# Patient Record
Sex: Female | Born: 1990 | Race: Black or African American | Hispanic: No | Marital: Single | Smoking: Never smoker
Health system: Southern US, Community
[De-identification: ages and names within clinical notes are randomized; demographics above are authoritative.]

## PROBLEM LIST (undated history)

## (undated) ENCOUNTER — Inpatient Hospital Stay (HOSPITAL_COMMUNITY): Payer: Self-pay

## (undated) DIAGNOSIS — N809 Endometriosis, unspecified: Secondary | ICD-10-CM

## (undated) DIAGNOSIS — D573 Sickle-cell trait: Secondary | ICD-10-CM

## (undated) DIAGNOSIS — O9989 Other specified diseases and conditions complicating pregnancy, childbirth and the puerperium: Secondary | ICD-10-CM

## (undated) DIAGNOSIS — O219 Vomiting of pregnancy, unspecified: Secondary | ICD-10-CM

## (undated) DIAGNOSIS — M542 Cervicalgia: Secondary | ICD-10-CM

## (undated) DIAGNOSIS — J45909 Unspecified asthma, uncomplicated: Secondary | ICD-10-CM

## (undated) DIAGNOSIS — M549 Dorsalgia, unspecified: Secondary | ICD-10-CM

## (undated) DIAGNOSIS — F419 Anxiety disorder, unspecified: Secondary | ICD-10-CM

## (undated) DIAGNOSIS — T4145XA Adverse effect of unspecified anesthetic, initial encounter: Secondary | ICD-10-CM

## (undated) DIAGNOSIS — G43019 Migraine without aura, intractable, without status migrainosus: Secondary | ICD-10-CM

## (undated) DIAGNOSIS — R51 Headache: Secondary | ICD-10-CM

## (undated) DIAGNOSIS — T8859XA Other complications of anesthesia, initial encounter: Secondary | ICD-10-CM

## (undated) HISTORY — PX: OOPHORECTOMY: SHX86

## (undated) HISTORY — PX: WISDOM TOOTH EXTRACTION: SHX21

## (undated) HISTORY — DX: Migraine without aura, intractable, without status migrainosus: G43.019

## (undated) HISTORY — PX: OVARIAN CYST REMOVAL: SHX89

---

## 2015-05-04 ENCOUNTER — Inpatient Hospital Stay (HOSPITAL_COMMUNITY)
Admission: AD | Admit: 2015-05-04 | Discharge: 2015-05-04 | Disposition: A | Discharge status: Home / Self Care | Payer: Medicaid Other | Source: Ambulatory Visit | Attending: Obstetrics and Gynecology | Admitting: Obstetrics and Gynecology

## 2015-05-04 ENCOUNTER — Encounter (HOSPITAL_COMMUNITY): Payer: Self-pay

## 2015-05-04 DIAGNOSIS — M545 Low back pain: Secondary | ICD-10-CM | POA: Insufficient documentation

## 2015-05-04 DIAGNOSIS — R51 Headache: Secondary | ICD-10-CM | POA: Insufficient documentation

## 2015-05-04 DIAGNOSIS — O9989 Other specified diseases and conditions complicating pregnancy, childbirth and the puerperium: Secondary | ICD-10-CM | POA: Insufficient documentation

## 2015-05-04 DIAGNOSIS — O210 Mild hyperemesis gravidarum: Secondary | ICD-10-CM | POA: Insufficient documentation

## 2015-05-04 DIAGNOSIS — O219 Vomiting of pregnancy, unspecified: Secondary | ICD-10-CM

## 2015-05-04 DIAGNOSIS — Z3A01 Less than 8 weeks gestation of pregnancy: Secondary | ICD-10-CM | POA: Diagnosis not present

## 2015-05-04 DIAGNOSIS — M549 Dorsalgia, unspecified: Secondary | ICD-10-CM | POA: Diagnosis not present

## 2015-05-04 DIAGNOSIS — J029 Acute pharyngitis, unspecified: Secondary | ICD-10-CM | POA: Insufficient documentation

## 2015-05-04 HISTORY — DX: Other complications of anesthesia, initial encounter: T88.59XA

## 2015-05-04 HISTORY — DX: Endometriosis, unspecified: N80.9

## 2015-05-04 HISTORY — DX: Unspecified asthma, uncomplicated: J45.909

## 2015-05-04 HISTORY — DX: Adverse effect of unspecified anesthetic, initial encounter: T41.45XA

## 2015-05-04 LAB — CBC WITH DIFFERENTIAL/PLATELET
Basophils Absolute: 0 10*3/uL (ref 0.0–0.1)
Basophils Relative: 0 % (ref 0–1)
Eosinophils Absolute: 0 10*3/uL (ref 0.0–0.7)
Eosinophils Relative: 1 % (ref 0–5)
HCT: 33.8 % — ABNORMAL LOW (ref 36.0–46.0)
HEMOGLOBIN: 11.4 g/dL — AB (ref 12.0–15.0)
LYMPHS ABS: 1 10*3/uL (ref 0.7–4.0)
LYMPHS PCT: 15 % (ref 12–46)
MCH: 28.3 pg (ref 26.0–34.0)
MCHC: 33.7 g/dL (ref 30.0–36.0)
MCV: 83.9 fL (ref 78.0–100.0)
MONOS PCT: 7 % (ref 3–12)
Monocytes Absolute: 0.5 10*3/uL (ref 0.1–1.0)
NEUTROS ABS: 5.2 10*3/uL (ref 1.7–7.7)
Neutrophils Relative %: 77 % (ref 43–77)
Platelets: 208 10*3/uL (ref 150–400)
RBC: 4.03 MIL/uL (ref 3.87–5.11)
RDW: 14.3 % (ref 11.5–15.5)
WBC: 6.8 10*3/uL (ref 4.0–10.5)

## 2015-05-04 LAB — COMPREHENSIVE METABOLIC PANEL
ALT: 15 U/L (ref 14–54)
ANION GAP: 6 (ref 5–15)
AST: 20 U/L (ref 15–41)
Albumin: 3.4 g/dL — ABNORMAL LOW (ref 3.5–5.0)
Alkaline Phosphatase: 44 U/L (ref 38–126)
BUN: 8 mg/dL (ref 6–20)
CO2: 25 mmol/L (ref 22–32)
Calcium: 8.9 mg/dL (ref 8.9–10.3)
Chloride: 104 mmol/L (ref 101–111)
Creatinine, Ser: 0.75 mg/dL (ref 0.44–1.00)
GFR calc Af Amer: >60 mL/min (ref >60)
GFR calc non Af Amer: >60 mL/min (ref >60)
Glucose, Bld: 84 mg/dL (ref 65–99)
Potassium: 4.2 mmol/L (ref 3.5–5.1)
Sodium: 135 mmol/L (ref 135–145)
Total Bilirubin: 0.4 mg/dL (ref 0.3–1.2)
Total Protein: 6.8 g/dL (ref 6.5–8.1)

## 2015-05-04 LAB — URINALYSIS, ROUTINE W REFLEX MICROSCOPIC
Bilirubin Urine: NEGATIVE
Glucose, UA: NEGATIVE mg/dL
Ketones, ur: NEGATIVE mg/dL
Leukocytes, UA: NEGATIVE
Nitrite: NEGATIVE
PROTEIN: NEGATIVE mg/dL
SPECIFIC GRAVITY, URINE: 1.02 (ref 1.005–1.030)
Urobilinogen, UA: 0.2 mg/dL (ref 0.0–1.0)
pH: 5.5 (ref 5.0–8.0)

## 2015-05-04 LAB — URINE MICROSCOPIC-ADD ON: WBC, UA: NONE SEEN WBC/hpf (ref <3)

## 2015-05-04 LAB — POCT PREGNANCY, URINE: Preg Test, Ur: POSITIVE — AB

## 2015-05-04 LAB — RAPID STREP SCREEN (MED CTR MEBANE ONLY): Streptococcus, Group A Screen (Direct): NEGATIVE

## 2015-05-04 MED ORDER — LIDOCAINE VISCOUS 2 % MT SOLN
15.0000 mL | Freq: Once | OROMUCOSAL | Status: AC
  Administered 2015-05-04: 15 mL via OROMUCOSAL
  Filled 2015-05-04: qty 15

## 2015-05-04 MED ORDER — MENTHOL 3 MG MT LOZG
1.0000 | LOZENGE | Freq: Once | OROMUCOSAL | Status: AC
  Administered 2015-05-04: 3 mg via ORAL
  Filled 2015-05-04: qty 9

## 2015-05-04 MED ORDER — PHENOL 1.4 % MT LIQD
1.0000 | OROMUCOSAL | Status: DC | PRN
  Administered 2015-05-04: 1 via OROMUCOSAL
  Filled 2015-05-04: qty 177

## 2015-05-04 MED ORDER — CYCLOBENZAPRINE HCL 10 MG PO TABS
10.0000 mg | ORAL_TABLET | Freq: Once | ORAL | Status: AC
  Administered 2015-05-04: 10 mg via ORAL
  Filled 2015-05-04: qty 1

## 2015-05-04 MED ORDER — PROMETHAZINE HCL 25 MG PO TABS: 25.0000 mg | ORAL_TABLET | Freq: Four times a day (QID) | ORAL | Status: DC | PRN

## 2015-05-04 MED ORDER — LACTATED RINGERS IV BOLUS (SEPSIS)
1000.0000 mL | Freq: Once | INTRAVENOUS | Status: AC
  Administered 2015-05-04: 1000 mL via INTRAVENOUS

## 2015-05-04 MED ORDER — CYCLOBENZAPRINE HCL 10 MG PO TABS: 10.0000 mg | ORAL_TABLET | Freq: Two times a day (BID) | ORAL | Status: DC | PRN

## 2015-05-04 MED ORDER — PROMETHAZINE HCL 25 MG/ML IJ SOLN
Freq: Once | INTRAMUSCULAR | Status: AC
  Administered 2015-05-04: 18:00:00 via INTRAVENOUS
  Filled 2015-05-04: qty 1000

## 2015-05-04 MED ORDER — PROMETHAZINE HCL 25 MG/ML IJ SOLN: 12.5000 mg | Freq: Once | INTRAMUSCULAR | Status: DC

## 2015-05-04 MED ORDER — ACETAMINOPHEN 500 MG PO TABS
1000.0000 mg | ORAL_TABLET | Freq: Once | ORAL | Status: AC
  Administered 2015-05-04: 1000 mg via ORAL
  Filled 2015-05-04: qty 2

## 2015-05-04 NOTE — Discharge Instructions (Signed)

## 2015-05-04 NOTE — MAU Provider Note (Signed)
  History     CSN: [id number]  Arrival date and time: 05/04/15 1346   None     Chief Complaint  Patient presents with  . Generalized Body Aches  . Fever  . Headache  . Sore Throat  . Morning Sickness   HPI Pt is 7 weeks0d pregnant that presents with sore throat, achy neck, headache,low back pain, vomiting, Feeling hot and having chills.  Pt has had decreased appetite and vomiting- able to keep gingerale and gatorade  RN note: Onset of body aches, fever, sore throat, headache x 2 days, morning sickness continues  No past medical history on file.  No past surgical history on file.  No family history on file.  History  Substance Use Topics  . Smoking status: Not on file  . Smokeless tobacco: Not on file  . Alcohol Use: Not on file    Allergies: Allergies not on file  No prescriptions prior to admission    Review of Systems  Constitutional: Positive for fever, chills and malaise/fatigue.  HENT: Positive for sore throat.   Respiratory: Positive for cough. Negative for hemoptysis and wheezing.        Clear sputum  Gastrointestinal: Positive for nausea and vomiting. Negative for abdominal pain.  Genitourinary: Negative for dysuria, urgency and frequency.  Musculoskeletal: Positive for back pain.  Neurological: Positive for headaches.   Physical Exam   Height 5' 9.5" (1.765 m), weight 244 lb 6.4 oz (110.859 kg).  Physical Exam  Nursing note and vitals reviewed. Constitutional: She is oriented to person, place, and time. She appears well-developed and well-nourished.  HENT:  Head: Normocephalic.  Bilateral edematous pharynx /tonsils Left submandibular node enlarged and tender  Eyes: Pupils are equal, round, and reactive to light.  Neck: Normal range of motion. Neck supple.  Cardiovascular: Normal rate.   Respiratory: Effort normal.  GI: Soft. She exhibits no distension. There is no tenderness. There is no rebound and no guarding.  Musculoskeletal: Normal range  of motion.  Neurological: She is alert and oriented to person, place, and time.  Skin: Skin is warm and dry.  Psychiatric: She has a normal mood and affect.    MAU Course  Procedures IVF Tylenol for headache, sore throat and back ache Pt did not get relief of backache with tylenol and now nauseated Will give flexeril and phenergan Sore throat persists - will given cepracol, chloraseptic spray and vicous lidocaine which helped 2nd IVF with phenergan 25 mg- nausea improved Back pain improved with Flexeril Dr. Willis Modena consulted Assessment and Plan  Pharyngitis- Cepacol, Chloraseptic spray; gargle with equal parts of liquid benadryl and cherry maalox and/or salt water Back pain- Rx Flexeril Nausea and vomiting - Rx phenergan-  Tylenol  Rest and fluids- work note until Monday July 25 [redacted]w[redacted]d 1st trimester F/u with Dr. Terri Piedra for [facility] care  Amyra Vantuyl 05/04/2015, 2:07 PM

## 2015-05-04 NOTE — MAU Note (Signed)
Onset of body aches, fever, sore throat, headache x 2 days, morning sickness continues.

## 2015-05-06 LAB — CULTURE, GROUP A STREP: STREP A CULTURE: NEGATIVE

## 2015-05-23 LAB — OB RESULTS CONSOLE HIV ANTIBODY (ROUTINE TESTING): HIV: NONREACTIVE

## 2015-05-23 LAB — OB RESULTS CONSOLE HEPATITIS B SURFACE ANTIGEN: HEP B S AG: NEGATIVE

## 2015-05-23 LAB — OB RESULTS CONSOLE RPR: RPR: NONREACTIVE

## 2015-06-05 ENCOUNTER — Inpatient Hospital Stay (HOSPITAL_COMMUNITY)
Admission: AD | Admit: 2015-06-05 | Discharge: 2015-06-06 | Disposition: A | Discharge status: Home / Self Care | Payer: Medicaid Other | Source: Ambulatory Visit | Attending: Obstetrics and Gynecology | Admitting: Obstetrics and Gynecology

## 2015-06-05 ENCOUNTER — Encounter (HOSPITAL_COMMUNITY): Payer: Self-pay | Admitting: *Deleted

## 2015-06-05 DIAGNOSIS — R112 Nausea with vomiting, unspecified: Secondary | ICD-10-CM | POA: Diagnosis not present

## 2015-06-05 DIAGNOSIS — O2691 Pregnancy related conditions, unspecified, first trimester: Secondary | ICD-10-CM | POA: Diagnosis not present

## 2015-06-05 DIAGNOSIS — R519 Headache, unspecified: Secondary | ICD-10-CM

## 2015-06-05 DIAGNOSIS — J45909 Unspecified asthma, uncomplicated: Secondary | ICD-10-CM | POA: Diagnosis not present

## 2015-06-05 DIAGNOSIS — O26891 Other specified pregnancy related conditions, first trimester: Secondary | ICD-10-CM

## 2015-06-05 DIAGNOSIS — R51 Headache: Secondary | ICD-10-CM | POA: Insufficient documentation

## 2015-06-05 DIAGNOSIS — O219 Vomiting of pregnancy, unspecified: Secondary | ICD-10-CM | POA: Diagnosis not present

## 2015-06-05 DIAGNOSIS — D573 Sickle-cell trait: Secondary | ICD-10-CM | POA: Insufficient documentation

## 2015-06-05 HISTORY — DX: Sickle-cell trait: D57.3

## 2015-06-05 LAB — URINALYSIS, ROUTINE W REFLEX MICROSCOPIC
BILIRUBIN URINE: NEGATIVE
GLUCOSE, UA: NEGATIVE mg/dL
HGB URINE DIPSTICK: NEGATIVE
Ketones, ur: NEGATIVE mg/dL
Leukocytes, UA: NEGATIVE
Nitrite: NEGATIVE
Protein, ur: NEGATIVE mg/dL
Specific Gravity, Urine: 1.015 (ref 1.005–1.030)
Urobilinogen, UA: 0.2 mg/dL (ref 0.0–1.0)
pH: 6 (ref 5.0–8.0)

## 2015-06-05 MED ORDER — PROMETHAZINE HCL 25 MG PO TABS
25.0000 mg | ORAL_TABLET | Freq: Once | ORAL | Status: AC
  Administered 2015-06-05: 25 mg via ORAL
  Filled 2015-06-05: qty 1

## 2015-06-05 MED ORDER — ONDANSETRON 8 MG PO TBDP: 8.0000 mg | ORAL_TABLET | Freq: Once | ORAL | Status: DC

## 2015-06-05 MED ORDER — IBUPROFEN 800 MG PO TABS
800.0000 mg | ORAL_TABLET | Freq: Once | ORAL | Status: AC
  Administered 2015-06-05: 800 mg via ORAL
  Filled 2015-06-05: qty 1

## 2015-06-05 MED ORDER — BUTALBITAL-APAP-CAFFEINE 50-325-40 MG PO TABS: 2.0000 | ORAL_TABLET | Freq: Once | ORAL | Status: DC

## 2015-06-05 NOTE — MAU Provider Note (Signed)
History     CSN: [id number]  Arrival date and time: 06/05/15 2113   First Provider Initiated Contact with Patient 06/05/15 2256      Chief Complaint  Patient presents with  . Headache   Headache  This is a new problem. The current episode started today. The problem occurs constantly. The problem has been unchanged. The pain is located in the left unilateral region. The pain does not radiate. The pain quality is similar to prior headaches. The quality of the pain is described as throbbing. The pain is at a severity of 7/10. Associated symptoms include a fever, nausea and vomiting. Pertinent negatives include no abdominal pain. Nothing aggravates the symptoms. She has tried acetaminophen (last took at 1600 ) for the symptoms. The treatment provided mild relief.  Emesis  This is a new problem. The current episode started in the past 7 days. The problem occurs 5 to 10 times per day. The problem has been unchanged. The emesis has an appearance of stomach contents. There has been no fever. Associated symptoms include a fever and headaches. Pertinent negatives include no abdominal pain or diarrhea. Risk factors: pregnancy  She has tried diet change, increased fluids and sleep for the symptoms. The treatment provided no relief.    Past Medical History  Diagnosis Date  . Endometriosis   . Asthma   . Complication of anesthesia     bp drops  . Sickle cell trait     Past Surgical History  Procedure Laterality Date  . Ovarian cyst removal    . Wisdom tooth extraction    . Oophorectomy      left    History reviewed. No pertinent family history.  Social History  Substance Use Topics  . Smoking status: Never Smoker   . Smokeless tobacco: None  . Alcohol Use: Yes     Comment: not whiile preg    Allergies:  Allergies  Allergen Reactions  . Codeine Other (See Comments)    Reaction: causes sore throat  . Contrast Media [Iodinated Diagnostic Agents] Nausea And Vomiting  . Shellfish  Allergy Swelling    Prescriptions prior to admission  Medication Sig Dispense Refill Last Dose  . acetaminophen (TYLENOL) 500 MG tablet Take 500 mg by mouth every 6 (six) hours as needed for moderate pain.   Past Month at Unknown time  . albuterol (PROVENTIL HFA;VENTOLIN HFA) 108 (90 BASE) MCG/ACT inhaler Inhale 2 puffs into the lungs every 6 (six) hours as needed for wheezing or shortness of breath.   Past Month at Unknown time  . Doxylamine-Pyridoxine (DICLEGIS) 10-10 MG TBEC Take 1 tablet by mouth 4 (four) times daily.   Past Month at Unknown time  . Prenatal Vit-Fe Fumarate-FA (PRENATAL MULTIVITAMIN) TABS tablet Take 1 tablet by mouth daily at 12 noon.   Past Week at Unknown time  . cyclobenzaprine (FLEXERIL) 10 MG tablet Take 1 tablet (10 mg total) by mouth 2 (two) times daily as needed for muscle spasms. 20 tablet 0 More than a month at Unknown time  . promethazine (PHENERGAN) 25 MG tablet Take 1 tablet (25 mg total) by mouth every 6 (six) hours as needed for nausea or vomiting. 30 tablet 0 More than a month at Unknown time    Review of Systems  Constitutional: Positive for fever.  Gastrointestinal: Positive for nausea and vomiting. Negative for abdominal pain, diarrhea and constipation.  Genitourinary: Negative for dysuria, urgency and frequency.  Neurological: Positive for headaches.   Physical Exam  Blood pressure 103/68, pulse 86, temperature 98.9 F (37.2 C), temperature source Oral, resp. rate 16, height 5\' 9"  (1.753 m), weight 112.492 kg (248 lb), last menstrual period 03/16/2015.  Physical Exam  Nursing note and vitals reviewed. Constitutional: She is oriented to person, place, and time. She appears well-developed and well-nourished. No distress.  HENT:  Head: Normocephalic.  Cardiovascular: Normal rate.   Respiratory: Effort normal.  GI: Soft.  Neurological: She is alert and oriented to person, place, and time.  Skin: Skin is warm and dry.  Psychiatric: She has a  normal mood and affect.   Results for orders placed or performed during the hospital encounter of 06/05/15 (from the past 24 hour(s))  Urinalysis, Routine w reflex microscopic (not at [facility])     Status: None   Collection Time: 06/05/15 10:00 PM  Result Value Ref Range   Color, Urine YELLOW YELLOW   APPearance CLEAR CLEAR   Specific Gravity, Urine 1.015 1.005 - 1.030   pH 6.0 5.0 - 8.0   Glucose, UA NEGATIVE NEGATIVE mg/dL   Hgb urine dipstick NEGATIVE NEGATIVE   Bilirubin Urine NEGATIVE NEGATIVE   Ketones, ur NEGATIVE NEGATIVE mg/dL   Protein, ur NEGATIVE NEGATIVE mg/dL   Urobilinogen, UA 0.2 0.0 - 1.0 mg/dL   Nitrite NEGATIVE NEGATIVE   Leukocytes, UA NEGATIVE NEGATIVE     MAU Course  Procedures  MDM 0017: Patient reports that her nausea is gone with phenergan, and her headache is 2/10 with ibuprofen. No vomiting while here. Tolerating PO  0024: D/W Dr. Terri Piedra, ok for dc home. May have phenergan rx for dc.   Assessment and Plan   1. Nausea/vomiting in pregnancy   2. Headache in pregnancy, first trimester    DC home Comfort measures reviewed  1st Trimester precautions  RX: phenergan PRN #[id number]  Return to MAU as needed FU with OB as planned  Follow-up Information    Follow up with [facility], DO.   Specialty:  Obstetrics and Gynecology   Why:  As scheduled   Contact information:   [place] [address] [phone]         Mathis Bud 06/05/2015, 10:58 PM

## 2015-06-05 NOTE — MAU Note (Signed)
Pt 11 wks and was taking diclegis for N&V but it gave her H/A and night sweats and Dr. Terri Piedra took her off the medication.  Keeping liquids down but not solids today. Vomited 8X today.  Pt has a severe headache and took 2 ES tylenol at 1645 without relief.

## 2015-06-06 DIAGNOSIS — O219 Vomiting of pregnancy, unspecified: Secondary | ICD-10-CM | POA: Diagnosis not present

## 2015-06-06 MED ORDER — PROMETHAZINE HCL 25 MG PO TABS: 12.5000 mg | ORAL_TABLET | Freq: Four times a day (QID) | ORAL | Status: DC | PRN

## 2015-06-06 NOTE — Discharge Instructions (Signed)

## 2015-07-30 ENCOUNTER — Inpatient Hospital Stay (HOSPITAL_COMMUNITY)
Admission: AD | Admit: 2015-07-30 | Discharge: 2015-07-31 | Disposition: A | Discharge status: Home / Self Care | Payer: Medicaid Other | Source: Ambulatory Visit | Attending: Obstetrics and Gynecology | Admitting: Obstetrics and Gynecology

## 2015-07-30 DIAGNOSIS — R51 Headache: Secondary | ICD-10-CM | POA: Diagnosis not present

## 2015-07-30 DIAGNOSIS — O26893 Other specified pregnancy related conditions, third trimester: Secondary | ICD-10-CM | POA: Diagnosis not present

## 2015-07-30 DIAGNOSIS — Z3A19 19 weeks gestation of pregnancy: Secondary | ICD-10-CM | POA: Diagnosis not present

## 2015-07-30 DIAGNOSIS — O26892 Other specified pregnancy related conditions, second trimester: Secondary | ICD-10-CM

## 2015-07-30 LAB — URINALYSIS, ROUTINE W REFLEX MICROSCOPIC
Bilirubin Urine: NEGATIVE
GLUCOSE, UA: NEGATIVE mg/dL
HGB URINE DIPSTICK: NEGATIVE
Ketones, ur: 15 mg/dL — AB
Nitrite: NEGATIVE
PH: 6.5 (ref 5.0–8.0)
PROTEIN: NEGATIVE mg/dL
Specific Gravity, Urine: 1.02 (ref 1.005–1.030)
Urobilinogen, UA: 0.2 mg/dL (ref 0.0–1.0)

## 2015-07-30 LAB — URINE MICROSCOPIC-ADD ON

## 2015-07-30 MED ORDER — DEXAMETHASONE SODIUM PHOSPHATE 10 MG/ML IJ SOLN
10.0000 mg | Freq: Once | INTRAMUSCULAR | Status: AC
  Administered 2015-07-30: 10 mg via INTRAVENOUS
  Filled 2015-07-30: qty 1

## 2015-07-30 MED ORDER — LACTATED RINGERS IV BOLUS (SEPSIS)
1000.0000 mL | Freq: Once | INTRAVENOUS | Status: AC
  Administered 2015-07-30: 1000 mL via INTRAVENOUS

## 2015-07-30 MED ORDER — METOCLOPRAMIDE HCL 5 MG/ML IJ SOLN
10.0000 mg | Freq: Once | INTRAMUSCULAR | Status: AC
  Administered 2015-07-30: 10 mg via INTRAVENOUS
  Filled 2015-07-30: qty 2

## 2015-07-30 NOTE — MAU Note (Signed)
Pt reports she has had headaches throughout her pregnancy and has now had a headache since yesterday. Also reports lower abd pressure and tightness since last pm. Also reports she is having trouble breathing today.

## 2015-07-30 NOTE — Discharge Instructions (Signed)

## 2015-07-30 NOTE — MAU Provider Note (Signed)
Chief Complaint: No chief complaint on file.   First Provider Initiated Contact with Patient 07/30/15 2239      SUBJECTIVE HPI: Barbara Haas is a 24 y.o. G1P0 at [redacted]w[redacted]d by LMP who presents to maternity admissions reporting onset of headache yesterday that has gradually worsened today and abdominal cramping/pelvic pressure with onset today. She reports some headaches prior to pregnancy but worsening since pregnancy.  This h/a is associated with light sensitivity but she denies nausea or vomiting.  She reports shortness of breath when she is active or walking, but resolves easily with rest. She denies vaginal bleeding, vaginal itching/burning, urinary symptoms, dizziness, n/v, or fever/chills.     Headache  This is a new problem. The current episode started yesterday. The problem occurs constantly. The problem has been gradually worsening. The pain is located in the bilateral and frontal region. The pain does not radiate. The pain quality is similar to prior headaches. The quality of the pain is described as dull and squeezing. The pain is moderate. Associated symptoms include abdominal pain and photophobia. Pertinent negatives include no blurred vision, dizziness, eye pain, eye watering, fever, hearing loss, loss of balance, nausea, neck pain, rhinorrhea, sinus pressure, tinnitus, visual change, vomiting or weakness. The symptoms are aggravated by bright light. She has tried NSAIDs and acetaminophen for the symptoms. The treatment provided mild relief. Her past medical history is significant for migraine headaches.    Past Medical History  Diagnosis Date  . Endometriosis   . Asthma   . Complication of anesthesia     bp drops  . Sickle cell trait    Past Surgical History  Procedure Laterality Date  . Ovarian cyst removal    . Wisdom tooth extraction    . Oophorectomy      left   Social History   Social History  . Marital Status: Single    Spouse Name: N/A  . Number of Children:  N/A  . Years of Education: N/A   Occupational History  . Not on file.   Social History Main Topics  . Smoking status: Never Smoker   . Smokeless tobacco: Not on file  . Alcohol Use: Yes     Comment: not whiile preg  . Drug Use: No  . Sexual Activity: Yes   Other Topics Concern  . Not on file   Social History Narrative   No current facility-administered medications on file prior to encounter.   Current Outpatient Prescriptions on File Prior to Encounter  Medication Sig Dispense Refill  . acetaminophen (TYLENOL) 500 MG tablet Take 500 mg by mouth every 6 (six) hours as needed for moderate pain.    Marland Kitchen albuterol (PROVENTIL HFA;VENTOLIN HFA) 108 (90 BASE) MCG/ACT inhaler Inhale 2 puffs into the lungs every 6 (six) hours as needed for wheezing or shortness of breath.    . Prenatal Vit-Fe Fumarate-FA (PRENATAL MULTIVITAMIN) TABS tablet Take 1 tablet by mouth daily at 12 noon.    . cyclobenzaprine (FLEXERIL) 10 MG tablet Take 1 tablet (10 mg total) by mouth 2 (two) times daily as needed for muscle spasms. 20 tablet 0  . Doxylamine-Pyridoxine (DICLEGIS) 10-10 MG TBEC Take 1 tablet by mouth 4 (four) times daily.    . promethazine (PHENERGAN) 25 MG tablet Take 1 tablet (25 mg total) by mouth every 6 (six) hours as needed for nausea or vomiting. 30 tablet 0  . promethazine (PHENERGAN) 25 MG tablet Take 0.5-1 tablets (12.5-25 mg total) by mouth every 6 (six) hours as  needed. 30 tablet 0   Allergies  Allergen Reactions  . Codeine Other (See Comments)    Reaction: causes sore throat  . Contrast Media [Iodinated Diagnostic Agents] Nausea And Vomiting  . Shellfish Allergy Swelling    ROS:  Review of Systems  Constitutional: Negative for fever, chills and fatigue.  HENT: Negative for hearing loss, rhinorrhea, sinus pressure and tinnitus.   Eyes: Positive for photophobia. Negative for blurred vision and pain.  Respiratory: Negative for shortness of breath.   Cardiovascular: Negative for  chest pain.  Gastrointestinal: Positive for abdominal pain. Negative for nausea, vomiting, diarrhea and constipation.  Genitourinary: Negative for dysuria, frequency, flank pain, vaginal bleeding, vaginal discharge, difficulty urinating, vaginal pain and pelvic pain.  Musculoskeletal: Negative for neck pain.  Neurological: Positive for headaches. Negative for dizziness, weakness and loss of balance.  Psychiatric/Behavioral: Negative.      I have reviewed patient's Past Medical Hx, Surgical Hx, Family Hx, Social Hx, medications and allergies.   Physical Exam   Patient Vitals for the past 24 hrs:  BP Temp Temp src Pulse Resp SpO2  07/30/15 2146 129/78 mmHg 98.4 F (36.9 C) Oral 75 16 99 %   Constitutional: Well-developed, well-nourished female in no acute distress.  Cardiovascular: normal rate Respiratory: normal effort GI: Abd soft, non-tender. Pos BS x 4 MS: Extremities nontender, no edema, normal ROM Neurologic: Alert and oriented x 4.  GU: Neg CVAT.  FHT 152 by doppler  LAB RESULTS Results for orders placed or performed during the hospital encounter of 07/30/15 (from the past 24 hour(s))  Urinalysis, Routine w reflex microscopic (not at [facility])     Status: Abnormal   Collection Time: 07/30/15 10:07 PM  Result Value Ref Range   Color, Urine YELLOW YELLOW   APPearance CLEAR CLEAR   Specific Gravity, Urine 1.020 1.005 - 1.030   pH 6.5 5.0 - 8.0   Glucose, UA NEGATIVE NEGATIVE mg/dL   Hgb urine dipstick NEGATIVE NEGATIVE   Bilirubin Urine NEGATIVE NEGATIVE   Ketones, ur 15 (A) NEGATIVE mg/dL   Protein, ur NEGATIVE NEGATIVE mg/dL   Urobilinogen, UA 0.2 0.0 - 1.0 mg/dL   Nitrite NEGATIVE NEGATIVE   Leukocytes, UA TRACE (A) NEGATIVE  Urine microscopic-add on     Status: Abnormal   Collection Time: 07/30/15 10:07 PM  Result Value Ref Range   Squamous Epithelial / LPF RARE RARE   WBC, UA 3-6 <3 WBC/hpf   RBC / HPF 0-2 <3 RBC/hpf   Bacteria, UA FEW (A) RARE        IMAGING No results found.  MAU Management/MDM: Ordered labs and reviewed results.  Consult Dr Marvel Plan to review assessment and labs.  Treatments in MAU included LR x 1000 ml, Decadron 10 mg IV, and Reglan 10 mg IV.  No Benadryl given to complete headache cocktail because pt driving.  Pt to go home and take PO Benadryl if h/a persists.  After IV medication/fluids, pt reports abdominal cramping resolved completely and h/a is now very mild. Pt stable at time of discharge.  ASSESSMENT 1. Headache in pregnancy, antepartum, second trimester     PLAN Discharge home   Follow-up Information    Follow up with Logan Bores, MD.   Specialty:  Obstetrics and Gynecology   Why:  As scheduled   Contact information:   [address] [address] [phone]       Follow up with [facility].   Why:  As needed for emergencies   Contact information:   [address] [id number] [place] Certified Nurse-Midwife 07/30/2015  11:57 PM

## 2015-07-30 NOTE — MAU Note (Signed)
Nasal congestion today with a pounding headache.   Took 800mg  ibuprofen at 1500 and tylenol 500mg  @ 1900.  Pt fell asleep and woke up with same pain.

## 2015-09-26 ENCOUNTER — Inpatient Hospital Stay (HOSPITAL_COMMUNITY)
Admission: AD | Admit: 2015-09-26 | Payer: Medicaid Other | Source: Ambulatory Visit | Admitting: Obstetrics and Gynecology

## 2015-10-15 NOTE — L&D Delivery Note (Signed)
Pt complete and at +3 station with urge to push.  Pt pushed for about 36minsL to deliver a viable female infant in LOA position over intact perineum. Anterior and posterior shoulders spontaneously delivered with next two pushes; body easily followed next. Infant placed on mothers abdomen and bulb suction of mouth and nose performed. Cord was then clamped and cut by FOB after >27min. Cord blood obtained. Baby had a vigorous spontaneous cry noted. Placenta then delivered about 5 mins later intact, 3VC shultz. Fundal massage performed and pitocin per protocol. Fundus firm. Periurethral abrasion did not require stitches. Mother and baby stable. Counts correct. Apgars of 9 and 9

## 2015-11-17 LAB — OB RESULTS CONSOLE GBS: STREP GROUP B AG: POSITIVE

## 2015-11-17 LAB — OB RESULTS CONSOLE RUBELLA ANTIBODY, IGM: RUBELLA: IMMUNE

## 2015-11-23 LAB — OB RESULTS CONSOLE GC/CHLAMYDIA
CHLAMYDIA, DNA PROBE: NEGATIVE
GC PROBE AMP, GENITAL: NEGATIVE

## 2015-12-21 ENCOUNTER — Inpatient Hospital Stay (HOSPITAL_COMMUNITY): Payer: Medicaid Other | Admitting: Anesthesiology

## 2015-12-21 ENCOUNTER — Encounter (HOSPITAL_COMMUNITY): Payer: Self-pay

## 2015-12-21 ENCOUNTER — Inpatient Hospital Stay (HOSPITAL_COMMUNITY)
Admission: RE | Admit: 2015-12-21 | Discharge: 2015-12-24 | DRG: 775 | Disposition: A | Discharge status: Home / Self Care | Payer: Medicaid Other | Source: Ambulatory Visit | Attending: Obstetrics and Gynecology | Admitting: Obstetrics and Gynecology

## 2015-12-21 DIAGNOSIS — O480 Post-term pregnancy: Principal | ICD-10-CM | POA: Diagnosis present

## 2015-12-21 DIAGNOSIS — O99824 Streptococcus B carrier state complicating childbirth: Secondary | ICD-10-CM | POA: Diagnosis present

## 2015-12-21 DIAGNOSIS — N809 Endometriosis, unspecified: Secondary | ICD-10-CM | POA: Diagnosis present

## 2015-12-21 DIAGNOSIS — Z3A40 40 weeks gestation of pregnancy: Secondary | ICD-10-CM | POA: Diagnosis not present

## 2015-12-21 DIAGNOSIS — O9952 Diseases of the respiratory system complicating childbirth: Secondary | ICD-10-CM | POA: Diagnosis present

## 2015-12-21 DIAGNOSIS — J45909 Unspecified asthma, uncomplicated: Secondary | ICD-10-CM | POA: Diagnosis present

## 2015-12-21 DIAGNOSIS — D573 Sickle-cell trait: Secondary | ICD-10-CM | POA: Diagnosis present

## 2015-12-21 LAB — CBC
HEMATOCRIT: 31.3 % — AB (ref 36.0–46.0)
Hemoglobin: 10.5 g/dL — ABNORMAL LOW (ref 12.0–15.0)
MCH: 28.1 pg (ref 26.0–34.0)
MCHC: 33.5 g/dL (ref 30.0–36.0)
MCV: 83.7 fL (ref 78.0–100.0)
Platelets: 173 10*3/uL (ref 150–400)
RBC: 3.74 MIL/uL — ABNORMAL LOW (ref 3.87–5.11)
RDW: 14.8 % (ref 11.5–15.5)
WBC: 8.7 10*3/uL (ref 4.0–10.5)

## 2015-12-21 LAB — TYPE AND SCREEN
ABO/RH(D): O POS
Antibody Screen: NEGATIVE

## 2015-12-21 LAB — ABO/RH: ABO/RH(D): O POS

## 2015-12-21 LAB — RPR: RPR Ser Ql: NONREACTIVE

## 2015-12-21 MED ORDER — FENTANYL 2.5 MCG/ML BUPIVACAINE 1/10 % EPIDURAL INFUSION (WH - ANES)
14.0000 mL/h | INTRAMUSCULAR | Status: DC | PRN
  Administered 2015-12-21: 14 mL/h via EPIDURAL
  Filled 2015-12-21: qty 125

## 2015-12-21 MED ORDER — LACTATED RINGERS IV SOLN: 500.0000 mL | Freq: Once | INTRAVENOUS | Status: DC

## 2015-12-21 MED ORDER — PHENYLEPHRINE 40 MCG/ML (10ML) SYRINGE FOR IV PUSH (FOR BLOOD PRESSURE SUPPORT)
80.0000 ug | PREFILLED_SYRINGE | INTRAVENOUS | Status: DC | PRN
  Filled 2015-12-21: qty 2

## 2015-12-21 MED ORDER — ACETAMINOPHEN 325 MG PO TABS: 650.0000 mg | ORAL_TABLET | ORAL | Status: DC | PRN

## 2015-12-21 MED ORDER — PHENYLEPHRINE 40 MCG/ML (10ML) SYRINGE FOR IV PUSH (FOR BLOOD PRESSURE SUPPORT)
80.0000 ug | PREFILLED_SYRINGE | INTRAVENOUS | Status: DC | PRN
  Filled 2015-12-21: qty 2
  Filled 2015-12-21: qty 20

## 2015-12-21 MED ORDER — BUTORPHANOL TARTRATE 1 MG/ML IJ SOLN
INTRAMUSCULAR | Status: AC
  Administered 2015-12-21: 1 mg
  Filled 2015-12-21: qty 1

## 2015-12-21 MED ORDER — LACTATED RINGERS IV SOLN
500.0000 mL | INTRAVENOUS | Status: DC | PRN
  Administered 2015-12-21: 250 mL via INTRAVENOUS

## 2015-12-21 MED ORDER — BUTORPHANOL TARTRATE 1 MG/ML IJ SOLN: 1.0000 mg | INTRAMUSCULAR | Status: DC | PRN

## 2015-12-21 MED ORDER — PENICILLIN G POTASSIUM 5000000 UNITS IJ SOLR
5.0000 10*6.[IU] | Freq: Once | INTRAVENOUS | Status: AC
  Administered 2015-12-21: 5 10*6.[IU] via INTRAVENOUS
  Filled 2015-12-21: qty 5

## 2015-12-21 MED ORDER — EPHEDRINE 5 MG/ML INJ
10.0000 mg | INTRAVENOUS | Status: DC | PRN
  Filled 2015-12-21: qty 2

## 2015-12-21 MED ORDER — LACTATED RINGERS IV SOLN: INTRAVENOUS | Status: DC

## 2015-12-21 MED ORDER — MISOPROSTOL 25 MCG QUARTER TABLET
25.0000 ug | ORAL_TABLET | ORAL | Status: DC
  Administered 2015-12-21: 25 ug via VAGINAL
  Filled 2015-12-21: qty 0.25
  Filled 2015-12-21 (×3): qty 1
  Filled 2015-12-21: qty 0.25

## 2015-12-21 MED ORDER — CITRIC ACID-SODIUM CITRATE 334-500 MG/5ML PO SOLN: 30.0000 mL | ORAL | Status: DC | PRN

## 2015-12-21 MED ORDER — PENICILLIN G POTASSIUM 5000000 UNITS IJ SOLR
2.5000 10*6.[IU] | INTRAVENOUS | Status: DC
  Filled 2015-12-21 (×7): qty 2.5

## 2015-12-21 MED ORDER — OXYTOCIN BOLUS FROM INFUSION
500.0000 mL | INTRAVENOUS | Status: DC
  Administered 2015-12-22: 500 mL via INTRAVENOUS

## 2015-12-21 MED ORDER — OXYTOCIN 10 UNIT/ML IJ SOLN
2.5000 [IU]/h | INTRAVENOUS | Status: DC
  Administered 2015-12-22: 2.5 [IU]/h via INTRAVENOUS
  Filled 2015-12-21: qty 10

## 2015-12-21 MED ORDER — LIDOCAINE HCL (PF) 1 % IJ SOLN
30.0000 mL | INTRAMUSCULAR | Status: DC | PRN
  Filled 2015-12-21: qty 30

## 2015-12-21 MED ORDER — FLEET ENEMA 7-19 GM/118ML RE ENEM: 1.0000 | ENEMA | RECTAL | Status: DC | PRN

## 2015-12-21 MED ORDER — LIDOCAINE HCL (PF) 1 % IJ SOLN
INTRAMUSCULAR | Status: DC | PRN
  Administered 2015-12-21: 6 mL via EPIDURAL
  Administered 2015-12-21: 4 mL

## 2015-12-21 MED ORDER — DIPHENHYDRAMINE HCL 50 MG/ML IJ SOLN
12.5000 mg | INTRAMUSCULAR | Status: DC | PRN
Start: 2015-12-21 — End: 2015-12-22

## 2015-12-21 MED ORDER — ONDANSETRON HCL 4 MG/2ML IJ SOLN: 4.0000 mg | Freq: Four times a day (QID) | INTRAMUSCULAR | Status: DC | PRN

## 2015-12-21 NOTE — H&P (Signed)
Barbara Haas is a 25 y.o. female presenting for scheduled elective iol at term. Pt had a benign prenatal course with dating based on lmp and confirmed by first trimester ultrasound. She is SS trait positive but FOB is negative. CF negative. She has a history of labile BPs prior to pregnancy but was controlled without meds during pregnancy. She had a spell of depression but controlled without medication. Pt is GBS positive.  Maternal Medical History:  Reason for admission: Nausea. Elective term iol  Contractions: Onset was yesterday.   Frequency: irregular.   Perceived severity is mild.    Fetal activity: Perceived fetal activity is normal.   Last perceived fetal movement was within the past hour.    Prenatal complications: no prenatal complications Prenatal Complications - Diabetes: none.    OB History    Gravida Para Term Preterm AB TAB SAB Ectopic Multiple Living   1              Past Medical History  Diagnosis Date  . Endometriosis   . Asthma   . Complication of anesthesia     bp drops  . Sickle cell trait    Past Surgical History  Procedure Laterality Date  . Ovarian cyst removal    . Wisdom tooth extraction    . Oophorectomy      left   Family History: family history is not on file. Social History:  reports that she has never smoked. She does not have any smokeless tobacco history on file. She reports that she drinks alcohol. She reports that she does not use illicit drugs.   Prenatal Transfer Tool  Maternal Diabetes: No Genetic Screening: Declined Maternal Ultrasounds/Referrals: Normal Fetal Ultrasounds or other Referrals:  None Maternal Substance Abuse:  No Significant Maternal Medications:  None Significant Maternal Lab Results:  Lab values include: Group B Strep positive Other Comments:  None  Review of Systems  Constitutional: Positive for malaise/fatigue. Negative for fever and chills.  Eyes: Negative for blurred vision.  Respiratory: Negative  for shortness of breath.   Cardiovascular: Negative for leg swelling.  Gastrointestinal: Positive for abdominal pain. Negative for heartburn, nausea and vomiting.  Musculoskeletal: Negative for back pain.  Neurological: Negative for dizziness and headaches.  Psychiatric/Behavioral: Negative for depression. The patient is not nervous/anxious.       Last menstrual period 03/16/2015. Maternal Exam:  Uterine Assessment: Contraction strength is mild.  Contraction frequency is irregular.   Abdomen: Patient reports generalized tenderness.  Fetal presentation: vertex  Introitus: Normal vulva. Normal vagina.  Pelvis: adequate for delivery.   Cervix: Cervix evaluated by digital exam.     Physical Exam  Constitutional: She is oriented to person, place, and time. She appears well-developed.  Neck: Normal range of motion.  Cardiovascular: Normal rate.   Respiratory: Effort normal.  GI: Soft. There is generalized tenderness.  Genitourinary: Vagina normal and uterus normal.  Musculoskeletal: Normal range of motion. She exhibits no edema or tenderness.  Neurological: She is alert and oriented to person, place, and time.  Skin: Skin is warm.  Psychiatric: She has a normal mood and affect. Her behavior is normal. Judgment and thought content normal.    Prenatal labs: ABO, Rh:   Antibody:   Rubella:   RPR:    HBsAg:    HIV:    GBS:     Assessment/Plan: G1P0 at 81 0/7wks for elective iol at term GBS positive so will give PCN in active labor  Pain control prn Cytotec then  augment when able if no active labor  Anticipate svd  Bonnee Quin Birgit Nowling 12/21/2015, 8:45 AM

## 2015-12-21 NOTE — Consults (Signed)
  Anesthesia Pain Consult Note  Patient: Barbara Haas, 25 y.o., female  Consult Requested by: Sherlyn Hay, DO  Reason for Consult: CRNA Pain Rounds  Level of Consciousness: alert  Pain: none   Pain Goal: 7

## 2015-12-21 NOTE — Anesthesia Procedure Notes (Signed)
Epidural Patient location during procedure: OB  Preanesthetic Checklist Completed: patient identified, site marked, surgical consent, pre-op evaluation, timeout performed, IV checked, risks and benefits discussed and monitors and equipment checked  Epidural Patient position: sitting Prep: site prepped and draped and DuraPrep Patient monitoring: continuous pulse ox and blood pressure Approach: right paramedian Location: L3-L4 Injection technique: LOR air  Needle:  Needle type: Tuohy  Needle gauge: 17 G Needle length: 9 cm and 9 Needle insertion depth: 8 cm Catheter type: closed end flexible Catheter size: 19 Gauge Catheter at skin depth: 15 cm Test dose: negative  Assessment Events: blood not aspirated, injection not painful, no injection resistance, negative IV test and no paresthesia  Additional Notes Dosing of Epidural:  1st dose, through catheter ............................................Marland Kitchen  Xylocaine 40 mg  2nd dose, through catheter, after waiting 3 minutes........Marland KitchenXylocaine 60 mg    As each dose occurred, patient was free of IV sx; and patient exhibited no evidence of SA injection.  Patient is more comfortable after epidural dosed. Please see RN's note for documentation of vital signs,and FHR which are stable.  Patient reminded not to try to ambulate with numb legs, and that an RN must be present when she attempts to get up.

## 2015-12-21 NOTE — Anesthesia Preprocedure Evaluation (Addendum)
Anesthesia Evaluation  Patient identified by MRN, date of birth, ID band Patient awake    Reviewed: Allergy & Precautions, H&P , Patient's Chart, lab work & pertinent test results  Airway Mallampati: II  TM Distance: >3 FB Neck ROM: full    Dental  (+) Teeth Intact   Pulmonary asthma ,    breath sounds clear to auscultation       Cardiovascular  Rhythm:regular Rate:Normal     Neuro/Psych    GI/Hepatic   Endo/Other    Renal/GU      Musculoskeletal   Abdominal   Peds  Hematology   Anesthesia Other Findings       Reproductive/Obstetrics (+) Pregnancy                            Anesthesia Physical Anesthesia Plan  ASA: III  Anesthesia Plan: Epidural   Post-op Pain Management:    Induction:   Airway Management Planned:   Additional Equipment:   Intra-op Plan:   Post-operative Plan:   Informed Consent: I have reviewed the patients History and Physical, chart, labs and discussed the procedure including the risks, benefits and alternatives for the proposed anesthesia with the patient or authorized representative who has indicated his/her understanding and acceptance.   Dental Advisory Given  Plan Discussed with:   Anesthesia Plan Comments: (Labs checked- platelets confirmed with RN in room. Fetal heart tracing, per RN, reported to be stable enough for sitting procedure. Discussed epidural, and patient consents to the procedure:  included risk of possible headache,backache, failed block, allergic reaction, and nerve injury. This patient was asked if she had any questions or concerns before the procedure started.)        Anesthesia Quick Evaluation

## 2015-12-21 NOTE — Progress Notes (Signed)
Patient ID: Barbara Haas, female   DOB: 1991/05/27, 25 y.o.   MRN: [id number] Late entry notes  Pt has progressed through the day after AROM performed at 1500. She was 2-3cm dilated then and unable to receive second dose of cytotec due to contractions q 2-55mins.  Clear amniotic fluid noted She received iv pain medication and later an epidural- comfortable at this time Pt was checked 73mins ago and is now 7cm dilated.  GBS prophylaxis was started at 5cm dilated  Plan: continne with labor management          Recheck in 2-3hours          Anticipate svd

## 2015-12-22 ENCOUNTER — Encounter (HOSPITAL_COMMUNITY): Payer: Self-pay

## 2015-12-22 LAB — CBC
HCT: 28.6 % — ABNORMAL LOW (ref 36.0–46.0)
HEMOGLOBIN: 9.7 g/dL — AB (ref 12.0–15.0)
MCH: 28.5 pg (ref 26.0–34.0)
MCHC: 33.9 g/dL (ref 30.0–36.0)
MCV: 84.1 fL (ref 78.0–100.0)
Platelets: 173 10*3/uL (ref 150–400)
RBC: 3.4 MIL/uL — AB (ref 3.87–5.11)
RDW: 14.9 % (ref 11.5–15.5)
WBC: 13.9 10*3/uL — AB (ref 4.0–10.5)

## 2015-12-22 MED ORDER — ONDANSETRON HCL 4 MG/2ML IJ SOLN: 4.0000 mg | INTRAMUSCULAR | Status: DC | PRN

## 2015-12-22 MED ORDER — SENNOSIDES-DOCUSATE SODIUM 8.6-50 MG PO TABS
2.0000 | ORAL_TABLET | ORAL | Status: DC
  Administered 2015-12-23 – 2015-12-24 (×2): 2 via ORAL
  Filled 2015-12-22 (×2): qty 2

## 2015-12-22 MED ORDER — DIPHENHYDRAMINE HCL 25 MG PO CAPS
25.0000 mg | ORAL_CAPSULE | Freq: Four times a day (QID) | ORAL | Status: DC | PRN
  Administered 2015-12-24: 25 mg via ORAL
  Filled 2015-12-22: qty 1

## 2015-12-22 MED ORDER — TETANUS-DIPHTH-ACELL PERTUSSIS 5-2.5-18.5 LF-MCG/0.5 IM SUSP: 0.5000 mL | Freq: Once | INTRAMUSCULAR | Status: DC

## 2015-12-22 MED ORDER — ZOLPIDEM TARTRATE 5 MG PO TABS: 5.0000 mg | ORAL_TABLET | Freq: Every evening | ORAL | Status: DC | PRN

## 2015-12-22 MED ORDER — BENZOCAINE-MENTHOL 20-0.5 % EX AERO
1.0000 "application " | INHALATION_SPRAY | CUTANEOUS | Status: DC | PRN
  Administered 2015-12-22: 1 via TOPICAL
  Filled 2015-12-22: qty 56

## 2015-12-22 MED ORDER — ACETAMINOPHEN 325 MG PO TABS
650.0000 mg | ORAL_TABLET | ORAL | Status: DC | PRN
  Administered 2015-12-22 – 2015-12-23 (×6): 650 mg via ORAL
  Filled 2015-12-22 (×6): qty 2

## 2015-12-22 MED ORDER — LANOLIN HYDROUS EX OINT: TOPICAL_OINTMENT | CUTANEOUS | Status: DC | PRN

## 2015-12-22 MED ORDER — PRENATAL MULTIVITAMIN CH
1.0000 | ORAL_TABLET | Freq: Every day | ORAL | Status: DC
  Administered 2015-12-22 – 2015-12-23 (×2): 1 via ORAL
  Filled 2015-12-22 (×2): qty 1

## 2015-12-22 MED ORDER — ONDANSETRON HCL 4 MG PO TABS: 4.0000 mg | ORAL_TABLET | ORAL | Status: DC | PRN

## 2015-12-22 MED ORDER — SIMETHICONE 80 MG PO CHEW: 80.0000 mg | CHEWABLE_TABLET | ORAL | Status: DC | PRN

## 2015-12-22 MED ORDER — WITCH HAZEL-GLYCERIN EX PADS: 1.0000 "application " | MEDICATED_PAD | CUTANEOUS | Status: DC | PRN

## 2015-12-22 MED ORDER — IBUPROFEN 600 MG PO TABS
600.0000 mg | ORAL_TABLET | Freq: Four times a day (QID) | ORAL | Status: DC | PRN
  Administered 2015-12-22 – 2015-12-24 (×9): 600 mg via ORAL
  Filled 2015-12-22 (×9): qty 1

## 2015-12-22 MED ORDER — DIBUCAINE 1 % RE OINT: 1.0000 "application " | TOPICAL_OINTMENT | RECTAL | Status: DC | PRN

## 2015-12-22 NOTE — Lactation Note (Signed)
This note was copied from a baby's chart. Lactation Consultation Note  Patient Name: Barbara Haas [id number] Date: 12/22/2015 Reason for consult: Initial assessment   With this mom of a term baby, now 70 hours old. Baby was asleep in mom's arms at this visit. Baby is doing well with voids and stools, and mom reports breast feeding going well, .Basic breast feeding teaching done from the baby and Me book, and lactation services reviewed. Mom given hand pump at her request, and instructed in it's use. Mom has a Medela DEP at home, and needs to buy tubing for it. I told her she could buy it at our lactation store. Mom knows to call for questions/concerns.   Maternal Data Formula Feeding for Exclusion: Yes Reason for exclusion: Mother's choice to formula and breast feed on admission Has patient been taught Hand Expression?: Yes Does the patient have breastfeeding experience prior to this delivery?: No  Feeding Feeding Type: Breast Fed Length of feed: 10 min  LATCH Score/Interventions       Type of Nipple: Everted at rest and after stimulation (no shaft and soft)  Comfort (Breast/Nipple): Soft / non-tender           Lactation Tools Discussed/Used     Consult Status Consult Status: Follow-up Date: 12/23/15 Follow-up type: In-patient    Tonna Corner 12/22/2015, 12:24 PM

## 2015-12-22 NOTE — Progress Notes (Signed)
Patient ID: Barbara Haas, female   DOB: 1991-05-26, 25 y.o.   MRN: [id number] Pt doing well. Pain well controlled with ibuprofen, lochia mild, ambulating and tolerating diet well. Bonding with baby and breastfeeding well. No complaints VSS ABD-soft, fundus firm EXT- no Homans  A/P: PPD#0 s/p svd at term after elective iol         Stable status         Routine pp care

## 2015-12-22 NOTE — Anesthesia Postprocedure Evaluation (Signed)
Anesthesia Post Note  Patient: Barbara Haas  Procedure(s) Performed: * No procedures listed *  Patient location during evaluation: Mother Baby Anesthesia Type: Epidural Level of consciousness: awake and alert Pain management: pain level controlled Vital Signs Assessment: post-procedure vital signs reviewed and stable Respiratory status: spontaneous breathing Cardiovascular status: stable Postop Assessment: no headache, no backache, epidural receding and patient able to bend at knees Anesthetic complications: no    Last Vitals:  Filed Vitals:   12/22/15 0230 12/22/15 0330  BP: 126/60 133/77  Pulse: 90 82  Temp: 36.5 C 36.7 C  Resp: 18 18    Last Pain:  Filed Vitals:   12/22/15 0615  PainSc: 6                  Everette Rank

## 2015-12-23 NOTE — Clinical Social Work Maternal (Signed)
CLINICAL SOCIAL WORK MATERNAL/CHILD NOTE  Patient Details  Name: Barbara Haas MRN: [id number] Date of Birth: [birth year]  Date:  12/23/2015  Clinical Social Worker Initiating Note:  Lucita Ferrara MSW, LCSW Date/ Time Initiated:  12/23/15/0930     Child's Name:  Butch Penny   Legal Guardian:  Romeo Apple and Glendon Axe Aminu  Need for Interpreter:  None   Date of Referral:  12/22/15     Reason for Referral:  Depressive symptoms during pregnancy  Referral Source:  [facility]   Address:  [address] [place], [place] [address]  Phone number:  [phone]   Household Members:  Significant Other   Natural Supports (not living in the home):  Immediate Family, Extended Family   Professional Supports: None   Employment: Unemployed   Type of Work:   Probation officer, Scientist, water quality  Education:    MOB reported intentions to return to school for an AA degree in the fall of 2017.  Financial Resources:  Medicaid   Other Resources:  [employer], Food Stamps    Cultural/Religious Considerations Which May Impact Care:  None reported  Strengths:  Ability to meet basic needs , Pediatrician chosen , Home prepared for child    Risk Factors/Current Problems:   1. Mental Health Concerns -- onset of depressive symptoms during the pregnancy. MOB reported symptoms resolved once she began to explore reasons for symptoms and engaged in behavioral changes.   Cognitive State:  Able to Concentrate , Alert , Linear Thinking , Insightful    Mood/Affect:  Bright , Happy    CSW Assessment:  CSW received request for consult due to MOB presenting with a history of onset of depressive symptoms during the pregnancy. Per chart review, MOB endorsed feelings of depression and sadness prenatally, was prescribed Zoloft, but never started medication.     During this admission, MOB presented as easily engaged and receptive to the visit. She displayed a full range in affect, was frequently smiling, and openly discussed  her mental health history.  Per MOB, she has no prior mental health history, but discussed onset of symptoms early in pregnancy.  MOB stated that due to physical complications in the pregnancy, she was unable to work (was previously working as a Probation officer and as a Scientist, water quality). She shared that she was home by herself while the FOB worked. She discussed challenges to adjusting to her new schedule since she was used to interacting with others and feeling productive. Per MOB, she felt tearful, isolated, and alone.  She discussed increase in irritability, and found that she had less patience with her family and FOB.  MOB confirmed that she spoke to Dr. Terri Piedra about her concerns, was prescribed Zoloft, but then decided to not start the medication out of fear that it would cause harm to the infant.  MOB stated that she decided to identify what may be triggering symptoms, and then create a plan to address the concerns.  MOB shared that she felt "better" when she felt productive and working toward a goal. She discussed how she began excited as she prepared for the infant, began to knit again, and prepared for her baby shower.    MOB shared that as she transitions postpartum, she feels that she will be "okay" since she can focus on caring for the infant. She recognized that it will continue to be important for her to interact with others, and plan activities to get her out of the home. MOB discussed at length vacations and outings that  she has already started to plan for this coming summer.  MOB also expressed comfort in contacting family for extra help and support.  MOB shared that the FOB will continue to work long hours, but discussed how he has been able to adjust his schedule to have more consecutive time off. She shared belief that this will help her feel that parenting responsibilities are more balanced, and that she is not parenting alone.  MOB was receptive to establishing realistic expectations for herself as well,  in order to ensure that she has time to care for herself.    MOB receptive to education on the baby blues and perinatal mood disorders. She shared that she feels comfortable talking to Dr. Terri Piedra about her mood if needs arise, and recognized that her symptoms during the pregnancy may increase her risk postpartum.  MOB was receptive to reviewing and applying previous learned strategies to her future that may help to support her mental health postpartum. MOB expressed interest in referrals for outpatient therapy in the event that it is needed. MOB's comment highlight insight and motivation to be proactive.   MOB denied questions, concerns, or needs at this time. She expressed appreciation for the visit, and stated that she is excited and eager for discharge. MOB shared that she had a better than anticipated childbirth experience, and reported belief that she has had a positive transition postpartum.   CSW Plan/Description:   1. Patient/Family Education -- perinatal mood and anxiety disorders 2. Information/Referral to [employer] -- outpatient mental health resources  3. No Further Intervention Required/No Barriers to Discharge    Sharyl Nimrod 12/23/2015, 10:26 AM

## 2015-12-23 NOTE — Progress Notes (Signed)
PPD #1 No problems Afeb, VSS Fundus firm, NT at U-1 Continue routine postpartum care 

## 2015-12-24 MED ORDER — IBUPROFEN 600 MG PO TABS: 600.0000 mg | ORAL_TABLET | Freq: Four times a day (QID) | ORAL | Status: DC | PRN

## 2015-12-24 NOTE — Progress Notes (Signed)
PPD #2 Doing well Afeb, VSS D/c home 

## 2015-12-24 NOTE — Discharge Instructions (Signed)
As per discharge pamphlet °

## 2015-12-24 NOTE — Discharge Summary (Signed)
OB Discharge Summary     Patient Name: Barbara Haas DOB: 05-09-1991 MRN: [id number]  Date of admission: 12/21/2015 Delivering MD: [facility]   Date of discharge: 12/24/2015  Admitting diagnosis: INDUCTION Intrauterine pregnancy: [redacted]w[redacted]d     Secondary diagnosis:  Active Problems:   Indication for care in labor and delivery, antepartum   SVD (spontaneous vaginal delivery)   Postpartum care following vaginal delivery      Discharge diagnosis: Term Pregnancy Delivered                                                                                                 Augmentation: AROM and Pitocin  Complications: None  Hospital course:  Induction of Labor With Vaginal Delivery   25 y.o. yo G1P1001 at [redacted]w[redacted]d was admitted to the hospital 12/21/2015 for induction of labor.  Indication for induction: Favorable cervix at term.  Patient had an uncomplicated labor course as follows: Membrane Rupture Time/Date: 3:00 PM ,12/21/2015   Intrapartum Procedures: Episiotomy: None [1]                                         Lacerations:  None [1]  Patient had delivery of a Viable infant.  Information for the patient's newborn:  Anaida, Ceja Girl Aubriel [id number]  Delivery Method: Vaginal, Spontaneous Delivery (Filed from Delivery Summary)   12/22/2015  Details of delivery can be found in separate delivery note.  Patient had a routine postpartum course. Patient is discharged home 12/24/2015.   Physical exam  Filed Vitals:   12/22/15 1732 12/23/15 0700 12/23/15 1800 12/24/15 0626  BP: 132/64 137/82 115/64 129/76  Pulse: 86 78 87 77  Temp: 97.8 F (36.6 C) 98.3 F (36.8 C) 98.2 F (36.8 C) 98.1 F (36.7 C)  TempSrc: Oral   Oral  Resp: 18 18 18 19   Height:      Weight:      SpO2:   100% 100%   General: alert Lochia: appropriate Uterine Fundus: firm  Labs: Lab Results  Component Value Date   WBC 13.9* 12/22/2015   HGB 9.7* 12/22/2015   HCT 28.6* 12/22/2015   MCV 84.1  12/22/2015   PLT 173 12/22/2015   CMP Latest Ref Rng 05/04/2015  Glucose 65 - 99 mg/dL 84  BUN 6 - 20 mg/dL 8  Creatinine 0.44 - 1.00 mg/dL 0.75  Sodium 135 - 145 mmol/L 135  Potassium 3.5 - 5.1 mmol/L 4.2  Chloride 101 - 111 mmol/L 104  CO2 22 - 32 mmol/L 25  Calcium 8.9 - 10.3 mg/dL 8.9  Total Protein 6.5 - 8.1 g/dL 6.8  Total Bilirubin 0.3 - 1.2 mg/dL 0.4  Alkaline Phos 38 - 126 U/L 44  AST 15 - 41 U/L 20  ALT 14 - 54 U/L 15    Discharge instruction: per After Visit Summary and "Baby and Me Booklet".  After visit meds:    Medication List    STOP taking these medications  promethazine 25 MG tablet  Commonly known as:  PHENERGAN      TAKE these medications        acetaminophen 500 MG tablet  Commonly known as:  TYLENOL  Take 500 mg by mouth every 6 (six) hours as needed for moderate pain.     albuterol 108 (90 Base) MCG/ACT inhaler  Commonly known as:  PROVENTIL HFA;VENTOLIN HFA  Inhale 2 puffs into the lungs every 6 (six) hours as needed for wheezing or shortness of breath.     cyclobenzaprine 10 MG tablet  Commonly known as:  FLEXERIL  Take 1 tablet (10 mg total) by mouth 2 (two) times daily as needed for muscle spasms.     ibuprofen 600 MG tablet  Commonly known as:  ADVIL,MOTRIN  Take 1 tablet (600 mg total) by mouth every 6 (six) hours as needed for cramping.        Diet: routine diet  Activity: Advance as tolerated. Pelvic rest for 6 weeks.   Outpatient follow up:6 weeks   Newborn Data: Live born female  Birth Weight: 7 lb 6 oz (3345 g) APGAR: 8, 9  Baby Feeding: Breast Disposition:home with mother   12/24/2015 Clarene Duke, MD

## 2015-12-27 ENCOUNTER — Inpatient Hospital Stay (HOSPITAL_COMMUNITY)
Admission: AD | Admit: 2015-12-27 | Discharge: 2015-12-27 | Disposition: A | Discharge status: Home / Self Care | Payer: Medicaid Other | Source: Ambulatory Visit | Attending: Obstetrics and Gynecology | Admitting: Obstetrics and Gynecology

## 2015-12-27 ENCOUNTER — Encounter (HOSPITAL_COMMUNITY): Payer: Self-pay

## 2015-12-27 DIAGNOSIS — O9089 Other complications of the puerperium, not elsewhere classified: Secondary | ICD-10-CM

## 2015-12-27 DIAGNOSIS — R51 Headache: Secondary | ICD-10-CM | POA: Insufficient documentation

## 2015-12-27 DIAGNOSIS — O9989 Other specified diseases and conditions complicating pregnancy, childbirth and the puerperium: Secondary | ICD-10-CM | POA: Diagnosis not present

## 2015-12-27 LAB — COMPREHENSIVE METABOLIC PANEL
ALT: 14 U/L (ref 14–54)
AST: 17 U/L (ref 15–41)
Albumin: 3.1 g/dL — ABNORMAL LOW (ref 3.5–5.0)
Alkaline Phosphatase: 88 U/L (ref 38–126)
Anion gap: 7 (ref 5–15)
BILIRUBIN TOTAL: 0.3 mg/dL (ref 0.3–1.2)
BUN: 13 mg/dL (ref 6–20)
CHLORIDE: 106 mmol/L (ref 101–111)
CO2: 27 mmol/L (ref 22–32)
CREATININE: 0.77 mg/dL (ref 0.44–1.00)
Calcium: 8.6 mg/dL — ABNORMAL LOW (ref 8.9–10.3)
Glucose, Bld: 82 mg/dL (ref 65–99)
POTASSIUM: 3.8 mmol/L (ref 3.5–5.1)
Sodium: 140 mmol/L (ref 135–145)
TOTAL PROTEIN: 6.8 g/dL (ref 6.5–8.1)

## 2015-12-27 LAB — URINE MICROSCOPIC-ADD ON

## 2015-12-27 LAB — URINALYSIS, ROUTINE W REFLEX MICROSCOPIC
BILIRUBIN URINE: NEGATIVE
Glucose, UA: NEGATIVE mg/dL
Ketones, ur: NEGATIVE mg/dL
Leukocytes, UA: NEGATIVE
NITRITE: NEGATIVE
PROTEIN: NEGATIVE mg/dL
SPECIFIC GRAVITY, URINE: 1.02 (ref 1.005–1.030)
pH: 5.5 (ref 5.0–8.0)

## 2015-12-27 LAB — CBC
HEMATOCRIT: 30.7 % — AB (ref 36.0–46.0)
Hemoglobin: 10.6 g/dL — ABNORMAL LOW (ref 12.0–15.0)
MCH: 29.1 pg (ref 26.0–34.0)
MCHC: 34.5 g/dL (ref 30.0–36.0)
MCV: 84.3 fL (ref 78.0–100.0)
PLATELETS: 216 10*3/uL (ref 150–400)
RBC: 3.64 MIL/uL — ABNORMAL LOW (ref 3.87–5.11)
RDW: 14.7 % (ref 11.5–15.5)
WBC: 7.2 10*3/uL (ref 4.0–10.5)

## 2015-12-27 MED ORDER — BUTALBITAL-APAP-CAFFEINE 50-325-40 MG PO TABS: 1.0000 | ORAL_TABLET | Freq: Four times a day (QID) | ORAL | Status: DC | PRN

## 2015-12-27 MED ORDER — BUTALBITAL-APAP-CAFFEINE 50-325-40 MG PO TABS
2.0000 | ORAL_TABLET | Freq: Once | ORAL | Status: AC
  Administered 2015-12-27: 2 via ORAL
  Filled 2015-12-27: qty 2

## 2015-12-27 NOTE — MAU Provider Note (Signed)
History     CSN: [id number]  Arrival date and time: 12/27/15 2115   First Provider Initiated Contact with Patient 12/27/15 2208      Chief Complaint  Patient presents with  . Headache   Headache  This is a new problem. The current episode started yesterday. The problem occurs constantly. The problem has been unchanged. The pain is located in the right unilateral, frontal and vertex region. The pain quality is similar to prior headaches (has had headache cocktail here in the past. ). The quality of the pain is described as throbbing. The pain is at a severity of 6/10. Pertinent negatives include no abdominal pain, blurred vision, fever, nausea or vomiting. Exacerbated by: worse at night  She has tried acetaminophen and NSAIDs for the symptoms. The treatment provided no relief.    Past Medical History  Diagnosis Date  . Endometriosis   . Asthma   . Complication of anesthesia     bp drops  . Sickle cell trait [facility])     Past Surgical History  Procedure Laterality Date  . Ovarian cyst removal    . Wisdom tooth extraction    . Oophorectomy      left    History reviewed. No pertinent family history.  Social History  Substance Use Topics  . Smoking status: Never Smoker   . Smokeless tobacco: None  . Alcohol Use: Yes     Comment: not whiile preg    Allergies:  Allergies  Allergen Reactions  . Codeine Other (See Comments)    Reaction: causes sore throat  . Contrast Media [Iodinated Diagnostic Agents] Nausea And Vomiting  . Shellfish Allergy Swelling    Prescriptions prior to admission  Medication Sig Dispense Refill Last Dose  . acetaminophen (TYLENOL) 500 MG tablet Take 500 mg by mouth every 6 (six) hours as needed for moderate pain.   12/27/2015 at Unknown time  . ibuprofen (ADVIL,MOTRIN) 600 MG tablet Take 1 tablet (600 mg total) by mouth every 6 (six) hours as needed for cramping. 30 tablet 0 12/27/2015 at 1400  . albuterol (PROVENTIL HFA;VENTOLIN HFA) 108 (90 BASE)  MCG/ACT inhaler Inhale 2 puffs into the lungs every 6 (six) hours as needed for wheezing or shortness of breath.   rescue  . cyclobenzaprine (FLEXERIL) 10 MG tablet Take 1 tablet (10 mg total) by mouth 2 (two) times daily as needed for muscle spasms. (Patient not taking: Reported on 12/21/2015) 20 tablet 0 Not Taking at Unknown time    Review of Systems  Constitutional: Negative for fever and chills.  Eyes: Negative for blurred vision.  Respiratory: Negative for shortness of breath.   Cardiovascular: Negative for chest pain.  Gastrointestinal: Negative for nausea, vomiting, abdominal pain, diarrhea and constipation.  Genitourinary: Negative for dysuria, urgency and frequency.  Neurological: Positive for headaches.   Physical Exam   Blood pressure 131/81, pulse 75, temperature 98.3 F (36.8 C), temperature source Oral, resp. rate 18, unknown if currently breastfeeding.  Physical Exam  Nursing note and vitals reviewed. Constitutional: She is oriented to person, place, and time. She appears well-developed and well-nourished. No distress.  HENT:  Head: Normocephalic.  Cardiovascular: Normal rate.   Respiratory: Effort normal.  GI: Soft. There is no tenderness. There is no rebound.  Musculoskeletal: She exhibits no edema or tenderness.  Left calf: 43.5 cm Right calf: 43 cm   Neurological: She is alert and oriented to person, place, and time.  Skin: Skin is warm and dry. No erythema.  Psychiatric:  She has a normal mood and affect.   Results for orders placed or performed during the hospital encounter of 12/27/15 (from the past 24 hour(s))  Urinalysis, Routine w reflex microscopic (not at [facility])     Status: Abnormal   Collection Time: 12/27/15 10:20 PM  Result Value Ref Range   Color, Urine YELLOW YELLOW   APPearance CLEAR CLEAR   Specific Gravity, Urine 1.020 1.005 - 1.030   pH 5.5 5.0 - 8.0   Glucose, UA NEGATIVE NEGATIVE mg/dL   Hgb urine dipstick TRACE (A) NEGATIVE   Bilirubin  Urine NEGATIVE NEGATIVE   Ketones, ur NEGATIVE NEGATIVE mg/dL   Protein, ur NEGATIVE NEGATIVE mg/dL   Nitrite NEGATIVE NEGATIVE   Leukocytes, UA NEGATIVE NEGATIVE  Urine microscopic-add on     Status: Abnormal   Collection Time: 12/27/15 10:20 PM  Result Value Ref Range   Squamous Epithelial / LPF 0-5 (A) NONE SEEN   WBC, UA 0-5 0 - 5 WBC/hpf   RBC / HPF 0-5 0 - 5 RBC/hpf   Bacteria, UA FEW (A) NONE SEEN  CBC     Status: Abnormal   Collection Time: 12/27/15 10:33 PM  Result Value Ref Range   WBC 7.2 4.0 - 10.5 K/uL   RBC 3.64 (L) 3.87 - 5.11 MIL/uL   Hemoglobin 10.6 (L) 12.0 - 15.0 g/dL   HCT 30.7 (L) 36.0 - 46.0 %   MCV 84.3 78.0 - 100.0 fL   MCH 29.1 26.0 - 34.0 pg   MCHC 34.5 30.0 - 36.0 g/dL   RDW 14.7 11.5 - 15.5 %   Platelets 216 150 - 400 K/uL  Comprehensive metabolic panel     Status: Abnormal   Collection Time: 12/27/15 10:33 PM  Result Value Ref Range   Sodium 140 135 - 145 mmol/L   Potassium 3.8 3.5 - 5.1 mmol/L   Chloride 106 101 - 111 mmol/L   CO2 27 22 - 32 mmol/L   Glucose, Bld 82 65 - 99 mg/dL   BUN 13 6 - 20 mg/dL   Creatinine, Ser 0.77 0.44 - 1.00 mg/dL   Calcium 8.6 (L) 8.9 - 10.3 mg/dL   Total Protein 6.8 6.5 - 8.1 g/dL   Albumin 3.1 (L) 3.5 - 5.0 g/dL   AST 17 15 - 41 U/L   ALT 14 14 - 54 U/L   Alkaline Phosphatase 88 38 - 126 U/L   Total Bilirubin 0.3 0.3 - 1.2 mg/dL   GFR calc non Af Amer >60 >60 mL/min   GFR calc Af Amer >60 >60 mL/min   Anion gap 7 5 - 15     MAU Course  Procedures  MDM 2328: Patient reports that her headache is now 3/10  2332: D/W Dr. Terri Piedra ok for dc home. Can have 8 percocet.  Patient with codeine allergy, will send with fiorcet for headaches  Assessment and Plan   1. Postpartum headache    DC home Comfort measures reviewed  RX: fioricet PRN #8  Return to MAU as needed FU with OB as planned  Follow-up Information    Follow up with [facility], DO.   Specialty:  Obstetrics and Gynecology   Why:  As  needed   Contact information:   [address] [place] [address] [phone]         Mathis Bud 12/27/2015, 10:10 PM

## 2015-12-27 NOTE — MAU Note (Signed)
Pt presents complaining of HA x3 days and calf pain. States her feet feel numb. Pain is worse at night. Delivered 3/10.

## 2015-12-27 NOTE — Discharge Instructions (Signed)

## 2016-06-28 ENCOUNTER — Encounter (HOSPITAL_COMMUNITY): Payer: Self-pay | Admitting: *Deleted

## 2016-06-28 ENCOUNTER — Emergency Department (HOSPITAL_COMMUNITY)
Admission: EM | Admit: 2016-06-28 | Discharge: 2016-06-28 | Disposition: A | Discharge status: Home / Self Care | Payer: Medicaid Other | Attending: Emergency Medicine | Admitting: Emergency Medicine

## 2016-06-28 DIAGNOSIS — B9789 Other viral agents as the cause of diseases classified elsewhere: Secondary | ICD-10-CM

## 2016-06-28 DIAGNOSIS — J069 Acute upper respiratory infection, unspecified: Secondary | ICD-10-CM

## 2016-06-28 DIAGNOSIS — J45909 Unspecified asthma, uncomplicated: Secondary | ICD-10-CM | POA: Insufficient documentation

## 2016-06-28 LAB — RAPID STREP SCREEN (MED CTR MEBANE ONLY): STREPTOCOCCUS, GROUP A SCREEN (DIRECT): NEGATIVE

## 2016-06-28 MED ORDER — IBUPROFEN 800 MG PO TABS: 800.0000 mg | ORAL_TABLET | Freq: Three times a day (TID) | ORAL | 0 refills | Status: DC | PRN

## 2016-06-28 MED ORDER — GUAIFENESIN ER 1200 MG PO TB12: 1.0000 | ORAL_TABLET | Freq: Two times a day (BID) | ORAL | 0 refills | Status: DC

## 2016-06-28 MED ORDER — PROMETHAZINE-DM 6.25-15 MG/5ML PO SYRP: 5.0000 mL | ORAL_SOLUTION | Freq: Four times a day (QID) | ORAL | 0 refills | Status: DC | PRN

## 2016-06-28 NOTE — ED Triage Notes (Signed)
Pt complains of sore throat, congestion and right ear pain since last night.

## 2016-06-28 NOTE — Discharge Instructions (Signed)
Return here as needed.  Increase her fluid intake, rest as much as possible.  Follow-up with a primary care doctor, urgent care.  Will call you if there is any need to add any antibiotics after the strep culture

## 2016-06-28 NOTE — ED Provider Notes (Signed)
Barbara Haas DEPT Provider Note   CSN: [id number] Arrival date & time: 06/28/16  2029     History   Chief Complaint Chief Complaint  Patient presents with  . URI    HPI Barbara Haas is a 25 y.o. female.  HPI Patient presents to the emergency department with sore throat, nasal congestion, body aches and cough.  The patient states that she is also having some discomfort in her right ear.  Patient states that nothing seems make the condition better or worse.  She states she took over-the-counter medications without much relief of her symptoms.  The patient denies chest pain, shortness of breath, headache,blurred vision, neck pain, fever,  weakness, numbness, dizziness, anorexia, edema, abdominal pain, nausea, vomiting, diarrhea, rash, back pain, dysuria, hematemesis, bloody stool, near syncope, or syncope. Past Medical History:  Diagnosis Date  . Asthma   . Complication of anesthesia    bp drops  . Endometriosis   . Sickle cell trait [facility])     Patient Active Problem List   Diagnosis Date Noted  . SVD (spontaneous vaginal delivery) 12/22/2015  . Postpartum care following vaginal delivery 12/22/2015  . Indication for care in labor and delivery, antepartum 12/21/2015    Past Surgical History:  Procedure Laterality Date  . OOPHORECTOMY     left  . OVARIAN CYST REMOVAL    . WISDOM TOOTH EXTRACTION      OB History    Gravida Para Term Preterm AB Living   1 1 1     1    SAB TAB Ectopic Multiple Live Births         0 1       Home Medications    Prior to Admission medications   Medication Sig Start Date End Date Taking? Authorizing Provider  albuterol (PROVENTIL HFA;VENTOLIN HFA) 108 (90 BASE) MCG/ACT inhaler Inhale 2 puffs into the lungs every 6 (six) hours as needed for wheezing or shortness of breath.    Historical Provider, MD  butalbital-acetaminophen-caffeine (FIORICET) [phone] MG tablet Take 1-2 tablets by mouth every 6 (six) hours as needed for headache.  12/27/15 12/26/16  Tresea Mall, CNM  cyclobenzaprine (FLEXERIL) 10 MG tablet Take 1 tablet (10 mg total) by mouth 2 (two) times daily as needed for muscle spasms. Patient not taking: Reported on 12/21/2015 05/04/15   West Pugh, NP  ibuprofen (ADVIL,MOTRIN) 600 MG tablet Take 1 tablet (600 mg total) by mouth every 6 (six) hours as needed for cramping. 12/24/15   Cheri Fowler, MD    Family History No family history on file.  Social History Social History  Substance Use Topics  . Smoking status: Never Smoker  . Smokeless tobacco: Never Used  . Alcohol use Yes     Comment: not whiile preg     Allergies   Codeine; Contrast media [iodinated diagnostic agents]; and Shellfish allergy   Review of Systems Review of Systems All other systems negative except as documented in the HPI. All pertinent positives and negatives as reviewed in the HPI.  Physical Exam Updated Vital Signs BP 130/87 (BP Location: Left Arm)   Pulse 87   Temp 98.2 F (36.8 C) (Oral)   Resp 18   Ht 5' 9.5" (1.765 m)   Wt 108.9 kg   LMP 05/29/2016   SpO2 98%   BMI 34.93 kg/m   Physical Exam  Constitutional: She is oriented to person, place, and time. She appears well-developed and well-nourished. No distress.  HENT:  Head: Normocephalic and  atraumatic.  Mouth/Throat: Oropharynx is clear and moist.  Eyes: Pupils are equal, round, and reactive to light.  Neck: Normal range of motion. Neck supple.  Cardiovascular: Normal rate, regular rhythm and normal heart sounds.  Exam reveals no gallop and no friction rub.   No murmur heard. Pulmonary/Chest: Effort normal and breath sounds normal. No respiratory distress. She has no wheezes.  Neurological: She is alert and oriented to person, place, and time. She exhibits normal muscle tone. Coordination normal.  Skin: Skin is warm and dry. No rash noted. No erythema.  Psychiatric: She has a normal mood and affect. Her behavior is normal.  Nursing note and vitals  reviewed.    ED Treatments / Results  Labs (all labs ordered are listed, but only abnormal results are displayed) Labs Reviewed  RAPID STREP SCREEN (NOT AT [facility])  CULTURE, GROUP A STREP [facility])    EKG  EKG Interpretation None       Radiology No results found.  Procedures Procedures (including critical care time)  Medications Ordered in ED Medications - No data to display   Initial Impression / Assessment and Plan / ED Course  I have reviewed the triage vital signs and the nursing notes.  Pertinent labs & imaging results that were available during my care of the patient were reviewed by me and considered in my medical decision making (see chart for details).  Clinical Course    Patient be treated for viral URI.  Told to return here as needed.  Patient agrees the plan and all questions were answered.  Told to rest as much possible and increase her fluid intake  Final Clinical Impressions(s) / ED Diagnoses   Final diagnoses:  None    New Prescriptions New Prescriptions   No medications on file     Dalia Heading, PA-C 06/28/16 [place], MD 07/02/16 1452

## 2016-07-01 LAB — CULTURE, GROUP A STREP (THRC)

## 2016-07-02 ENCOUNTER — Encounter (HOSPITAL_COMMUNITY): Payer: Self-pay | Admitting: *Deleted

## 2016-07-02 ENCOUNTER — Inpatient Hospital Stay (HOSPITAL_COMMUNITY)
Admission: AD | Admit: 2016-07-02 | Discharge: 2016-07-02 | Disposition: A | Discharge status: Home / Self Care | Payer: Medicaid Other | Source: Ambulatory Visit | Attending: Family Medicine | Admitting: Family Medicine

## 2016-07-02 DIAGNOSIS — A084 Viral intestinal infection, unspecified: Secondary | ICD-10-CM | POA: Insufficient documentation

## 2016-07-02 LAB — URINE MICROSCOPIC-ADD ON

## 2016-07-02 LAB — URINALYSIS, ROUTINE W REFLEX MICROSCOPIC
Bilirubin Urine: NEGATIVE
GLUCOSE, UA: NEGATIVE mg/dL
Ketones, ur: NEGATIVE mg/dL
Leukocytes, UA: NEGATIVE
Nitrite: NEGATIVE
Protein, ur: NEGATIVE mg/dL
SPECIFIC GRAVITY, URINE: 1.02 (ref 1.005–1.030)
pH: 6 (ref 5.0–8.0)

## 2016-07-02 LAB — POCT PREGNANCY, URINE: Preg Test, Ur: NEGATIVE

## 2016-07-02 LAB — WET PREP, GENITAL
Sperm: NONE SEEN
Trich, Wet Prep: NONE SEEN
Yeast Wet Prep HPF POC: NONE SEEN

## 2016-07-02 LAB — GC/CHLAMYDIA PROBE AMP (~~LOC~~) NOT AT ARMC
CHLAMYDIA, DNA PROBE: NEGATIVE
NEISSERIA GONORRHEA: NEGATIVE

## 2016-07-02 MED ORDER — PROMETHAZINE HCL 25 MG PO TABS: 12.5000 mg | ORAL_TABLET | Freq: Four times a day (QID) | ORAL | 0 refills | Status: DC | PRN

## 2016-07-02 MED ORDER — KETOROLAC TROMETHAMINE 60 MG/2ML IM SOLN
60.0000 mg | Freq: Once | INTRAMUSCULAR | Status: AC
  Administered 2016-07-02: 60 mg via INTRAMUSCULAR
  Filled 2016-07-02: qty 2

## 2016-07-02 MED ORDER — PROMETHAZINE HCL 25 MG PO TABS
25.0000 mg | ORAL_TABLET | Freq: Once | ORAL | Status: AC
  Administered 2016-07-02: 25 mg via ORAL
  Filled 2016-07-02: qty 1

## 2016-07-02 NOTE — MAU Provider Note (Signed)
History     CSN: [id number]  Arrival date and time: 07/02/16 [id number]   First Provider Initiated Contact with Patient 07/02/16 [phone]      Chief Complaint  Patient presents with  . Abdominal Pain   Barbara Haas is a 25 y.o. G1P1001 who presents today with epigastric and LLQ pain. She states that the pain started in the middle of the night, and she has vomited twice since it started. She denies any fever or sick contacts.    Abdominal Pain  This is a new problem. The current episode started today. The onset quality is gradual. The problem occurs intermittently. The pain is located in the LLQ. The pain is at a severity of 6/10. The quality of the pain is cramping and sharp. The abdominal pain radiates to the epigastric region. Associated symptoms include vomiting (x2 in the last 24 hours. ). Pertinent negatives include no constipation, diarrhea, dysuria, fever, frequency or nausea. Nothing aggravates the pain. The pain is relieved by nothing. She has tried nothing for the symptoms.    Past Medical History:  Diagnosis Date  . Asthma   . Complication of anesthesia    bp drops  . Endometriosis   . Sickle cell trait [facility])     Past Surgical History:  Procedure Laterality Date  . OOPHORECTOMY     left  . OVARIAN CYST REMOVAL    . WISDOM TOOTH EXTRACTION      History reviewed. No pertinent family history.  Social History  Substance Use Topics  . Smoking status: Never Smoker  . Smokeless tobacco: Never Used  . Alcohol use Yes     Comment: not whiile preg    Allergies:  Allergies  Allergen Reactions  . Codeine Other (See Comments)    Reaction: causes sore throat  . Contrast Media [Iodinated Diagnostic Agents] Nausea And Vomiting  . Shellfish Allergy Swelling    Prescriptions Prior to Admission  Medication Sig Dispense Refill Last Dose  . albuterol (PROVENTIL HFA;VENTOLIN HFA) 108 (90 BASE) MCG/ACT inhaler Inhale 2 puffs into the lungs every 6 (six) hours as needed for  wheezing or shortness of breath.   rescue  . butalbital-acetaminophen-caffeine (FIORICET) 50-325-40 MG tablet Take 1-2 tablets by mouth every 6 (six) hours as needed for headache. 8 tablet 0   . cyclobenzaprine (FLEXERIL) 10 MG tablet Take 1 tablet (10 mg total) by mouth 2 (two) times daily as needed for muscle spasms. (Patient not taking: Reported on 12/21/2015) 20 tablet 0 Not Taking at Unknown time  . Guaifenesin 1200 MG TB12 Take 1 tablet (1,200 mg total) by mouth 2 (two) times daily. 20 each 0   . ibuprofen (ADVIL,MOTRIN) 800 MG tablet Take 1 tablet (800 mg total) by mouth every 8 (eight) hours as needed. 21 tablet 0   . promethazine-dextromethorphan (PROMETHAZINE-DM) 6.25-15 MG/5ML syrup Take 5 mLs by mouth 4 (four) times daily as needed for cough. 120 mL 0     Review of Systems  Constitutional: Negative for chills and fever.  Gastrointestinal: Positive for abdominal pain and vomiting (x2 in the last 24 hours. ). Negative for constipation, diarrhea and nausea.  Genitourinary: Negative for dysuria, frequency and urgency.   Physical Exam   Blood pressure 129/79, pulse 99, temperature 98.1 F (36.7 C), temperature source Oral, resp. rate 18, weight 247 lb 1.9 oz (112.1 kg), last menstrual period 05/29/2016, SpO2 98 %, unknown if currently breastfeeding.  Physical Exam  Nursing note and vitals reviewed. Constitutional: She appears well-developed and  well-nourished. No distress.  HENT:  Head: Normocephalic.  Cardiovascular: Normal rate.   Respiratory: Effort normal.  GI: Soft. There is no tenderness. There is no rebound.  Neurological: She is alert.  Skin: Skin is warm and dry.  Psychiatric: She has a normal mood and affect.    MAU Course  Procedures  MDM Patient has been given toradol and phenergan. She reports improvement in her sx.   Assessment and Plan   1. Viral gastroenteritis    DC home Comfort measures reviewed  RX: phenergan PRN #[id number]  Return to MAU as  needed  Follow-up [place] .   Contact information: [place] [id number] [phone]           Mathis Bud 07/02/2016, 7:00 AM

## 2016-07-02 NOTE — MAU Note (Signed)
Patient presents to mau with c/o lower left abdominal pain that comes in waves. Patient endorses history of endometriosis and pain feels similar. Denies vaginal bleeding or discharge at this time. States has been vomiting x 2 days.

## 2016-07-02 NOTE — Discharge Instructions (Signed)

## 2016-10-14 NOTE — L&D Delivery Note (Signed)
Delivery Note Pt quickly progressed to complete with uncontrollable urge to push At 12:05 PM a viable female was delivered via Vaginal, Spontaneous (Presentation: OA;  ).  APGAR:9,9 , ; weight pending.   Placenta status: delivered intact, schultz , .  Cord:3vc  with the following complications:none .    Anesthesia:  none Episiotomy: None Lacerations: Periurethral Est. Blood Loss (mL): 150  Mom to postpartum.  Baby to Couplet care / Skin to Skin  Circ with pediatrician.  Isaiah Serge 10/09/2017, 12:28 PM

## 2016-10-15 ENCOUNTER — Encounter (HOSPITAL_COMMUNITY): Payer: Self-pay | Admitting: Emergency Medicine

## 2016-10-15 ENCOUNTER — Emergency Department (HOSPITAL_COMMUNITY): Payer: Self-pay

## 2016-10-15 ENCOUNTER — Emergency Department (HOSPITAL_COMMUNITY)
Admission: EM | Admit: 2016-10-15 | Discharge: 2016-10-15 | Disposition: A | Discharge status: Home / Self Care | Payer: Self-pay | Attending: Emergency Medicine | Admitting: Emergency Medicine

## 2016-10-15 DIAGNOSIS — R202 Paresthesia of skin: Secondary | ICD-10-CM | POA: Insufficient documentation

## 2016-10-15 DIAGNOSIS — R0789 Other chest pain: Secondary | ICD-10-CM | POA: Insufficient documentation

## 2016-10-15 DIAGNOSIS — R079 Chest pain, unspecified: Secondary | ICD-10-CM

## 2016-10-15 DIAGNOSIS — J45909 Unspecified asthma, uncomplicated: Secondary | ICD-10-CM | POA: Insufficient documentation

## 2016-10-15 HISTORY — DX: Anxiety disorder, unspecified: F41.9

## 2016-10-15 LAB — CBC WITH DIFFERENTIAL/PLATELET
BASOS ABS: 0 10*3/uL (ref 0.0–0.1)
BASOS PCT: 1 %
EOS ABS: 0.1 10*3/uL (ref 0.0–0.7)
EOS PCT: 1 %
HCT: 37.5 % (ref 36.0–46.0)
Hemoglobin: 12.7 g/dL (ref 12.0–15.0)
Lymphocytes Relative: 32 %
Lymphs Abs: 2 10*3/uL (ref 0.7–4.0)
MCH: 27.5 pg (ref 26.0–34.0)
MCHC: 33.9 g/dL (ref 30.0–36.0)
MCV: 81.3 fL (ref 78.0–100.0)
MONO ABS: 0.3 10*3/uL (ref 0.1–1.0)
MONOS PCT: 5 %
Neutro Abs: 3.9 10*3/uL (ref 1.7–7.7)
Neutrophils Relative %: 61 %
PLATELETS: 265 10*3/uL (ref 150–400)
RBC: 4.61 MIL/uL (ref 3.87–5.11)
RDW: 13.7 % (ref 11.5–15.5)
WBC: 6.4 10*3/uL (ref 4.0–10.5)

## 2016-10-15 LAB — I-STAT BETA HCG BLOOD, ED (MC, WL, AP ONLY)

## 2016-10-15 LAB — I-STAT CHEM 8, ED
BUN: 10 mg/dL (ref 6–20)
CREATININE: 0.9 mg/dL (ref 0.44–1.00)
Calcium, Ion: 1.16 mmol/L (ref 1.15–1.40)
Chloride: 103 mmol/L (ref 101–111)
Glucose, Bld: 83 mg/dL (ref 65–99)
HEMATOCRIT: 41 % (ref 36.0–46.0)
HEMOGLOBIN: 13.9 g/dL (ref 12.0–15.0)
POTASSIUM: 3.9 mmol/L (ref 3.5–5.1)
Sodium: 139 mmol/L (ref 135–145)
TCO2: 26 mmol/L (ref 0–100)

## 2016-10-15 LAB — D-DIMER, QUANTITATIVE (NOT AT ARMC): D DIMER QUANT: 0.93 ug{FEU}/mL — AB (ref 0.00–0.50)

## 2016-10-15 MED ORDER — IOPAMIDOL (ISOVUE-370) INJECTION 76%
100.0000 mL | Freq: Once | INTRAVENOUS | Status: AC | PRN
  Administered 2016-10-15: 100 mL via INTRAVENOUS

## 2016-10-15 MED ORDER — IOPAMIDOL (ISOVUE-370) INJECTION 76%
INTRAVENOUS | Status: AC
  Filled 2016-10-15: qty 100

## 2016-10-15 MED ORDER — DIPHENHYDRAMINE HCL 50 MG/ML IJ SOLN
25.0000 mg | Freq: Once | INTRAMUSCULAR | Status: AC
  Administered 2016-10-15: 25 mg via INTRAVENOUS
  Filled 2016-10-15: qty 1

## 2016-10-15 MED ORDER — ONDANSETRON HCL 4 MG/2ML IJ SOLN
4.0000 mg | Freq: Once | INTRAMUSCULAR | Status: AC
  Administered 2016-10-15: 4 mg via INTRAVENOUS
  Filled 2016-10-15: qty 2

## 2016-10-15 NOTE — ED Triage Notes (Signed)
Per EMS pt from home with tingling in arms and legs intermittently for past 2 weeks due to her anxiety.

## 2016-10-15 NOTE — ED Notes (Signed)
Pt ambulatory and independent at discharge.  Verbalized understanding of discharge instructions 

## 2016-10-15 NOTE — ED Provider Notes (Signed)
[place] DEPT Provider Note   CSN: [id number] Arrival date & time: 10/15/16  1501     History   Chief Complaint Chief Complaint  Patient presents with  . Anxiety    HPI Barbara Haas is a 26 y.o. female.  HPI Patient presents with 2 weeks of chest pain. Will come and go. It is in the right side of her chest. Worse with breathing and worse with palpation. Also has had episodes of her hands and feet tingling. It goes from about the knees down and states it feels like they're asleep. No fevers or chills. No cough. No weight loss. States he got worse today. States she felt lightheaded and felt as if she could pass out. She is adopted grossly found out who her biological family wasn't states that 2 of her siblings have heart problems. Patient denies substance abuse. Denies nervousness or anxiety. She also has had episodes of headache. They're dull and diffuse through the head.   Past Medical History:  Diagnosis Date  . Anxiety   . Asthma   . Complication of anesthesia    bp drops  . Endometriosis   . Sickle cell trait [facility])     Patient Active Problem List   Diagnosis Date Noted  . SVD (spontaneous vaginal delivery) 12/22/2015  . Postpartum care following vaginal delivery 12/22/2015  . Indication for care in labor and delivery, antepartum 12/21/2015    Past Surgical History:  Procedure Laterality Date  . OOPHORECTOMY     left  . OVARIAN CYST REMOVAL    . WISDOM TOOTH EXTRACTION      OB History    Gravida Para Term Preterm AB Living   1 1 1     1    SAB TAB Ectopic Multiple Live Births         0 1       Home Medications    Prior to Admission medications   Medication Sig Start Date End Date Taking? Authorizing Provider  albuterol (PROVENTIL HFA;VENTOLIN HFA) 108 (90 BASE) MCG/ACT inhaler Inhale 2 puffs into the lungs every 6 (six) hours as needed for wheezing or shortness of breath.    Historical Provider, MD  butalbital-acetaminophen-caffeine (FIORICET)  [phone] MG tablet Take 1-2 tablets by mouth every 6 (six) hours as needed for headache. 12/27/15 12/26/16  Tresea Mall, CNM  cyclobenzaprine (FLEXERIL) 10 MG tablet Take 1 tablet (10 mg total) by mouth 2 (two) times daily as needed for muscle spasms. Patient not taking: Reported on 12/21/2015 05/04/15   West Pugh, NP  Guaifenesin 1200 MG TB12 Take 1 tablet (1,200 mg total) by mouth 2 (two) times daily. 06/28/16   Dalia Heading, PA-C  ibuprofen (ADVIL,MOTRIN) 800 MG tablet Take 1 tablet (800 mg total) by mouth every 8 (eight) hours as needed. 06/28/16   Dalia Heading, PA-C  promethazine (PHENERGAN) 25 MG tablet Take 0.5-1 tablets (12.5-25 mg total) by mouth every 6 (six) hours as needed. 07/02/16   Tresea Mall, CNM  promethazine-dextromethorphan (PROMETHAZINE-DM) 6.25-15 MG/5ML syrup Take 5 mLs by mouth 4 (four) times daily as needed for cough. 06/28/16   Dalia Heading, PA-C    Family History No family history on file.  Social History Social History  Substance Use Topics  . Smoking status: Never Smoker  . Smokeless tobacco: Never Used  . Alcohol use Yes     Comment: not whiile preg     Allergies   Codeine; Contrast media [iodinated diagnostic agents]; and Shellfish allergy  Review of Systems Review of Systems  Constitutional: Negative for appetite change.  HENT: Negative for congestion.   Respiratory: Negative for choking and chest tightness.   Cardiovascular: Positive for chest pain.  Gastrointestinal: Negative for abdominal pain.  Genitourinary: Negative for dyspareunia.  Musculoskeletal: Negative for back pain.  Neurological: Positive for numbness.  Hematological: Negative for adenopathy.  Psychiatric/Behavioral: Negative for confusion.     Physical Exam Updated Vital Signs BP 143/80 (BP Location: Left Arm)   Pulse 80   Temp 98.3 F (36.8 C) (Oral)   Resp 13   SpO2 100%   Physical Exam  Constitutional: She appears well-developed.  HENT:    Head: Atraumatic.  Eyes: EOM are normal.  Neck: Neck supple.  Cardiovascular: Normal rate.   Pulmonary/Chest: She exhibits tenderness.  Tenderness to right anterior chest wall.  Musculoskeletal: She exhibits no edema.  Neurological: She is alert.  Sensation grossly intact over hands and feet. Strength intact also.  Skin: Skin is warm. Capillary refill takes less than 2 seconds.  Psychiatric: She has a normal mood and affect.     ED Treatments / Results  Labs (all labs ordered are listed, but only abnormal results are displayed) Labs Reviewed  D-DIMER, QUANTITATIVE (NOT AT [facility]) - Abnormal; Notable for the following:       Result Value   D-Dimer, Quant 0.93 (*)    All other components within normal limits  CBC WITH DIFFERENTIAL/PLATELET  I-STAT CHEM 8, ED  I-STAT BETA HCG BLOOD, ED ([facility], [facility], [facility] ONLY)    EKG  EKG Interpretation  Date/Time:  Tuesday October 15 2016 16:40:05 EST Ventricular Rate:  93 PR Interval:    QRS Duration: 77 QT Interval:  341 QTC Calculation: 425 R Axis:   80 Text Interpretation:  Sinus rhythm ST elev, probable normal early repol pattern Baseline wander in lead(s) V6 Confirmed by Alvino Chapel  MD, Shanita Kanan [id number]) on 10/15/2016 5:29:48 PM       Radiology Dg Chest 2 View  Result Date: 10/15/2016 CLINICAL DATA:  Midsternal chest pain. EXAM: CHEST  2 VIEW COMPARISON:  None. FINDINGS: Normal heart size and mediastinal contours. No acute infiltrate or edema. No effusion or pneumothorax. No acute osseous findings. IMPRESSION: Negative chest. Electronically Signed   By: Monte Fantasia M.D.   On: 10/15/2016 16:59   Ct Angio Chest Pe W And/or Wo Contrast  Result Date: 10/15/2016 CLINICAL DATA:  Initial evaluation for acute chest pain. Send at 1 he cold made frank EXAM: CT ANGIOGRAPHY CHEST WITH CONTRAST TECHNIQUE: Multidetector CT imaging of the chest was performed using the standard protocol during bolus administration of intravenous contrast. Multiplanar CT  image reconstructions and MIPs were obtained to evaluate the vascular anatomy. CONTRAST:  100 cc of Isovue 370. COMPARISON:  Prior radiograph from earlier the same day. FINDINGS: Cardiovascular: Intrathoracic aorta of normal caliber and appearance without acute abnormality. Visualized great vessels within normal limits. Heart size within normal limits.  No pericardial effusion. Pulmonary arterial tree adequately opacified for evaluation. Main pulmonary artery within normal limits for size. No filling defect to suggest acute pulmonary embolism identified. Please note evaluation for possible small distal emboli somewhat limited on this exam due to respiratory motion artifact. Mediastinum/Nodes: Thyroid normal. No mediastinal, hilar, or axillary adenopathy. Soft tissue density within the anterior mediastinum most compatible with normal residual thymic tissue. Esophagus normal. Lungs/Pleura: Lungs are clear. No focal infiltrates identified. No pulmonary edema or pleural effusion. No pneumothorax. Few subcentimeter nodular densities within the posterior right lower  lobe measure up to 5 mm, indeterminate. No other worrisome pulmonary nodule or mass. Upper Abdomen: Visualized upper abdomen within normal limits. Musculoskeletal: No acute osseous abnormality. No worrisome lytic or blastic osseous lesions. Review of the MIP images confirms the above findings. IMPRESSION: 1. No CT evidence for acute pulmonary embolism. 2. No other acute cardiopulmonary abnormality identified. 3. Few subpleural nodules measuring up to 5 mm within the posterior right lower lobe, of uncertain clinical significance. Please note that Fleischner criteria do not apply in patients of this age. Electronically Signed   By: Jeannine Boga M.D.   On: 10/15/2016 20:28    Procedures Procedures (including critical care time)  Medications Ordered in ED Medications  ondansetron (ZOFRAN) injection 4 mg (4 mg Intravenous Given 10/15/16 1859)    iopamidol (ISOVUE-370) 76 % injection 100 mL (100 mLs Intravenous Contrast Given 10/15/16 1928)  diphenhydrAMINE (BENADRYL) injection 25 mg (25 mg Intravenous Given 10/15/16 1953)     Initial Impression / Assessment and Plan / ED Course  I have reviewed the triage vital signs and the nursing notes.  Pertinent labs & imaging results that were available during my care of the patient were reviewed by me and considered in my medical decision making (see chart for details).  Clinical Course     Patient with chest pain and paresthesias. Tenderness to chest with pleuritic component. Occasional tachycardia. Positive d-dimer. CT scan done and did not show PE. Paresthesias are nonspecific. Will discharge home.  Final Clinical Impressions(s) / ED Diagnoses   Final diagnoses:  Paresthesias  Chest pain, unspecified type    New Prescriptions Discharge Medication List as of 10/15/2016  8:39 PM       Davonna Belling, MD 10/16/16 0144

## 2016-11-18 ENCOUNTER — Encounter (HOSPITAL_COMMUNITY): Payer: Self-pay

## 2016-11-18 ENCOUNTER — Emergency Department (HOSPITAL_COMMUNITY)
Admission: EM | Admit: 2016-11-18 | Discharge: 2016-11-18 | Disposition: A | Discharge status: Home / Self Care | Payer: Medicaid Other | Attending: Emergency Medicine | Admitting: Emergency Medicine

## 2016-11-18 DIAGNOSIS — R6889 Other general symptoms and signs: Secondary | ICD-10-CM

## 2016-11-18 DIAGNOSIS — Z79899 Other long term (current) drug therapy: Secondary | ICD-10-CM | POA: Insufficient documentation

## 2016-11-18 DIAGNOSIS — J45909 Unspecified asthma, uncomplicated: Secondary | ICD-10-CM | POA: Insufficient documentation

## 2016-11-18 DIAGNOSIS — R112 Nausea with vomiting, unspecified: Secondary | ICD-10-CM | POA: Insufficient documentation

## 2016-11-18 DIAGNOSIS — R197 Diarrhea, unspecified: Secondary | ICD-10-CM | POA: Insufficient documentation

## 2016-11-18 LAB — URINALYSIS, ROUTINE W REFLEX MICROSCOPIC
BILIRUBIN URINE: NEGATIVE
GLUCOSE, UA: NEGATIVE mg/dL
KETONES UR: NEGATIVE mg/dL
Nitrite: NEGATIVE
PH: 5 (ref 5.0–8.0)
Protein, ur: NEGATIVE mg/dL
SPECIFIC GRAVITY, URINE: 1.016 (ref 1.005–1.030)

## 2016-11-18 LAB — CBC WITH DIFFERENTIAL/PLATELET
Basophils Absolute: 0 10*3/uL (ref 0.0–0.1)
Basophils Relative: 1 %
EOS ABS: 0.1 10*3/uL (ref 0.0–0.7)
EOS PCT: 3 %
HCT: 37.1 % (ref 36.0–46.0)
Hemoglobin: 12.5 g/dL (ref 12.0–15.0)
LYMPHS ABS: 1.6 10*3/uL (ref 0.7–4.0)
Lymphocytes Relative: 31 %
MCH: 28.3 pg (ref 26.0–34.0)
MCHC: 33.7 g/dL (ref 30.0–36.0)
MCV: 83.9 fL (ref 78.0–100.0)
MONO ABS: 0.3 10*3/uL (ref 0.1–1.0)
MONOS PCT: 6 %
Neutro Abs: 3.1 10*3/uL (ref 1.7–7.7)
Neutrophils Relative %: 59 %
PLATELETS: 261 10*3/uL (ref 150–400)
RBC: 4.42 MIL/uL (ref 3.87–5.11)
RDW: 13.8 % (ref 11.5–15.5)
WBC: 5.2 10*3/uL (ref 4.0–10.5)

## 2016-11-18 LAB — COMPREHENSIVE METABOLIC PANEL
ALT: 8 U/L — ABNORMAL LOW (ref 14–54)
ANION GAP: 4 — AB (ref 5–15)
AST: 13 U/L — ABNORMAL LOW (ref 15–41)
Albumin: 3.8 g/dL (ref 3.5–5.0)
Alkaline Phosphatase: 48 U/L (ref 38–126)
BUN: 9 mg/dL (ref 6–20)
CHLORIDE: 105 mmol/L (ref 101–111)
CO2: 28 mmol/L (ref 22–32)
Calcium: 8.8 mg/dL — ABNORMAL LOW (ref 8.9–10.3)
Creatinine, Ser: 0.94 mg/dL (ref 0.44–1.00)
Glucose, Bld: 101 mg/dL — ABNORMAL HIGH (ref 65–99)
Potassium: 3.4 mmol/L — ABNORMAL LOW (ref 3.5–5.1)
SODIUM: 137 mmol/L (ref 135–145)
Total Bilirubin: 0.5 mg/dL (ref 0.3–1.2)
Total Protein: 7.3 g/dL (ref 6.5–8.1)

## 2016-11-18 LAB — LIPASE, BLOOD: LIPASE: 20 U/L (ref 11–51)

## 2016-11-18 LAB — POC URINE PREG, ED: PREG TEST UR: NEGATIVE

## 2016-11-18 MED ORDER — ONDANSETRON 8 MG PO TBDP: 8.0000 mg | ORAL_TABLET | Freq: Three times a day (TID) | ORAL | 0 refills | Status: DC | PRN

## 2016-11-18 MED ORDER — LOPERAMIDE HCL 2 MG PO CAPS: 2.0000 mg | ORAL_CAPSULE | Freq: Four times a day (QID) | ORAL | 0 refills | Status: DC | PRN

## 2016-11-18 MED ORDER — ONDANSETRON 8 MG PO TBDP
8.0000 mg | ORAL_TABLET | Freq: Once | ORAL | Status: AC
  Administered 2016-11-18: 8 mg via ORAL
  Filled 2016-11-18: qty 1

## 2016-11-18 NOTE — ED Triage Notes (Signed)
Pt here with flu like symptoms.  Pt states emesis with diarrhea and body aches.  Unknown for fever.  Pt states not keeping food down.

## 2016-11-18 NOTE — Discharge Instructions (Signed)
Continue to rest. Drink plenty of fluids. Ibuprofen/tylenol for body aches, headache, fever. Zofran as prescribed as needed for nausea. Imodium for diarrhea. Follow up with your doctor if not improving. Return if worsening.

## 2016-11-18 NOTE — ED Notes (Signed)
Bed: WTR6 Expected date:  Expected time:  Means of arrival:  Comments: 

## 2016-11-18 NOTE — ED Provider Notes (Signed)
[place] DEPT Provider Note   CSN: [id number] Arrival date & time: 11/18/16  0845     History   Chief Complaint Chief Complaint  Patient presents with  . Generalized Body Aches  . Emesis    HPI Barbara Haas is a 26 y.o. female.  HPI Barbara Haas is a 26 y.o. female presents to emergency department complaining of congestion, mild cough, nausea, vomiting, diarrhea, abdominal pain. She reports associated headache and bodyaches. Unsure of any fever, did not take her temperature. States that she can keep fluids down but anytime she eats anything comes back out or has instant diarrhea. She has not tried any medications for her symptoms at home. She states she has small children and is worried she may have the flu. She reports generalized abdominal pain and that she describes as cramping. She denies any blood in her stool or emesis. No urinary symptoms. Did not get flu shot this year. Cough is nonproductive. No sick contacts.  Past Medical History:  Diagnosis Date  . Anxiety   . Asthma   . Complication of anesthesia    bp drops  . Endometriosis   . Sickle cell trait [facility])     Patient Active Problem List   Diagnosis Date Noted  . SVD (spontaneous vaginal delivery) 12/22/2015  . Postpartum care following vaginal delivery 12/22/2015  . Indication for care in labor and delivery, antepartum 12/21/2015    Past Surgical History:  Procedure Laterality Date  . OOPHORECTOMY     left  . OVARIAN CYST REMOVAL    . WISDOM TOOTH EXTRACTION      OB History    Gravida Para Term Preterm AB Living   1 1 1     1    SAB TAB Ectopic Multiple Live Births         0 1       Home Medications    Prior to Admission medications   Medication Sig Start Date End Date Taking? Authorizing Provider  albuterol (PROVENTIL HFA;VENTOLIN HFA) 108 (90 BASE) MCG/ACT inhaler Inhale 2 puffs into the lungs every 6 (six) hours as needed for wheezing or shortness of breath.   Yes Historical  Provider, MD  butalbital-acetaminophen-caffeine (FIORICET) 50-325-40 MG tablet Take 1-2 tablets by mouth every 6 (six) hours as needed for headache. 12/27/15 12/26/16 Yes Heather D Hogan, CNM  ibuprofen (ADVIL,MOTRIN) 800 MG tablet Take 1 tablet (800 mg total) by mouth every 8 (eight) hours as needed. Patient taking differently: Take 800 mg by mouth every 8 (eight) hours as needed for mild pain.  06/28/16  Yes Christopher Lawyer, PA-C  Levonorgestrel-Ethinyl Estradiol (AMETHIA,CAMRESE) 0.15-0.03 &0.01 MG tablet Take 1 tablet by mouth every 3 (three) months. 07/15/16  Yes Historical Provider, MD  promethazine (PHENERGAN) 25 MG tablet Take 0.5-1 tablets (12.5-25 mg total) by mouth every 6 (six) hours as needed. Patient taking differently: Take 12.5-25 mg by mouth every 6 (six) hours as needed for nausea or vomiting.  07/02/16  Yes Tresea Mall, CNM  loperamide (IMODIUM) 2 MG capsule Take 1 capsule (2 mg total) by mouth 4 (four) times daily as needed for diarrhea or loose stools. 11/18/16   Lazaro Isenhower, PA-C  ondansetron (ZOFRAN ODT) 8 MG disintegrating tablet Take 1 tablet (8 mg total) by mouth every 8 (eight) hours as needed for nausea or vomiting. 11/18/16   Jeannett Senior, PA-C    Family History History reviewed. No pertinent family history.  Social History Social History  Substance Use Topics  .  Smoking status: Never Smoker  . Smokeless tobacco: Never Used  . Alcohol use Yes     Comment: not whiile preg     Allergies   Codeine; Contrast media [iodinated diagnostic agents]; Other; and Shellfish allergy   Review of Systems Review of Systems  Constitutional: Negative for chills and fever.  HENT: Positive for congestion. Negative for sore throat.   Respiratory: Positive for cough. Negative for chest tightness and shortness of breath.   Cardiovascular: Negative for chest pain, palpitations and leg swelling.  Gastrointestinal: Positive for abdominal pain, diarrhea, nausea and  vomiting.  Genitourinary: Negative for dysuria, flank pain, pelvic pain, vaginal bleeding, vaginal discharge and vaginal pain.  Musculoskeletal: Negative for arthralgias, myalgias, neck pain and neck stiffness.  Skin: Negative for rash.  Neurological: Negative for dizziness, weakness and headaches.  All other systems reviewed and are negative.    Physical Exam Updated Vital Signs BP 123/80 (BP Location: Left Arm)   Pulse 66   Temp 98.8 F (37.1 C) (Oral)   Resp 18   LMP 10/07/2016   SpO2 100%   Physical Exam  Constitutional: She appears well-developed and well-nourished. No distress.  HENT:  Head: Normocephalic.  Right Ear: External ear normal.  Left Ear: External ear normal.  Mouth/Throat: Oropharynx is clear and moist.  Nasal congestion present  Eyes: Conjunctivae are normal.  Neck: Neck supple.  Cardiovascular: Normal rate, regular rhythm and normal heart sounds.   Pulmonary/Chest: Effort normal and breath sounds normal. No respiratory distress. She has no wheezes. She has no rales.  Abdominal: Soft. Bowel sounds are normal. She exhibits no distension. There is tenderness. There is no rebound.  Diffuse tenderness  Musculoskeletal: She exhibits no edema.  Neurological: She is alert.  Skin: Skin is warm and dry.  Psychiatric: She has a normal mood and affect. Her behavior is normal.  Nursing note and vitals reviewed.    ED Treatments / Results  Labs (all labs ordered are listed, but only abnormal results are displayed) Labs Reviewed  COMPREHENSIVE METABOLIC PANEL - Abnormal; Notable for the following:       Result Value   Potassium 3.4 (*)    Glucose, Bld 101 (*)    Calcium 8.8 (*)    AST 13 (*)    ALT 8 (*)    Anion gap 4 (*)    All other components within normal limits  URINALYSIS, ROUTINE W REFLEX MICROSCOPIC - Abnormal; Notable for the following:    APPearance HAZY (*)    Hgb urine dipstick SMALL (*)    Leukocytes, UA MODERATE (*)    Bacteria, UA RARE  (*)    Squamous Epithelial / LPF 6-30 (*)    All other components within normal limits  CBC WITH DIFFERENTIAL/PLATELET  LIPASE, BLOOD  POC URINE PREG, ED    EKG  EKG Interpretation None       Radiology No results found.  Procedures Procedures (including critical care time)  Medications Ordered in ED Medications  ondansetron (ZOFRAN-ODT) disintegrating tablet 8 mg (8 mg Oral Given 11/18/16 1119)     Initial Impression / Assessment and Plan / ED Course  I have reviewed the triage vital signs and the nursing notes.  Pertinent labs & imaging results that were available during my care of the patient were reviewed by me and considered in my medical decision making (see chart for details).    Patient in emergency department with congestion, nausea, vomiting, diarrhea. No emesis in ED. She did get 8  mg of Zofran ODT. She is tolerating fluids without vomiting. Her blood work is unremarkable. VS all within normal. Afebrile. She is nontoxic appearing. Abdomen is diffusely tender however no peritoneal signs. Plan to discharge home with Imodium, Zofran, symptomatically treatment. Most likely viral syndrome, possibly influenza. Follow up with pcp. Return precautions discussed.   Final Clinical Impressions(s) / ED Diagnoses   Final diagnoses:  Flu-like symptoms  Nausea vomiting and diarrhea    New Prescriptions Discharge Medication List as of 11/18/2016 12:52 PM    START taking these medications   Details  loperamide (IMODIUM) 2 MG capsule Take 1 capsule (2 mg total) by mouth 4 (four) times daily as needed for diarrhea or loose stools., Starting Mon 11/18/2016, Print    ondansetron (ZOFRAN ODT) 8 MG disintegrating tablet Take 1 tablet (8 mg total) by mouth every 8 (eight) hours as needed for nausea or vomiting., Starting Mon 11/18/2016, Print         Jeannett Senior, PA-C 11/18/16 1542    Virgel Manifold, MD 11/20/16 1424

## 2016-11-18 NOTE — ED Notes (Signed)
Discharge instructions, follow up care, and rx x2 reviewed with patient. Patient verbalized understanding. 

## 2016-12-03 ENCOUNTER — Emergency Department (HOSPITAL_COMMUNITY)
Admission: EM | Admit: 2016-12-03 | Discharge: 2016-12-03 | Disposition: A | Discharge status: Home / Self Care | Payer: Medicaid Other | Attending: Emergency Medicine | Admitting: Emergency Medicine

## 2016-12-03 ENCOUNTER — Encounter (HOSPITAL_COMMUNITY): Payer: Self-pay | Admitting: Emergency Medicine

## 2016-12-03 DIAGNOSIS — J069 Acute upper respiratory infection, unspecified: Secondary | ICD-10-CM

## 2016-12-03 DIAGNOSIS — J45909 Unspecified asthma, uncomplicated: Secondary | ICD-10-CM | POA: Insufficient documentation

## 2016-12-03 DIAGNOSIS — R197 Diarrhea, unspecified: Secondary | ICD-10-CM

## 2016-12-03 DIAGNOSIS — B9789 Other viral agents as the cause of diseases classified elsewhere: Secondary | ICD-10-CM

## 2016-12-03 LAB — CBC
HEMATOCRIT: 35.9 % — AB (ref 36.0–46.0)
Hemoglobin: 12.3 g/dL (ref 12.0–15.0)
MCH: 28.3 pg (ref 26.0–34.0)
MCHC: 34.3 g/dL (ref 30.0–36.0)
MCV: 82.7 fL (ref 78.0–100.0)
PLATELETS: 228 10*3/uL (ref 150–400)
RBC: 4.34 MIL/uL (ref 3.87–5.11)
RDW: 13.4 % (ref 11.5–15.5)
WBC: 7.3 10*3/uL (ref 4.0–10.5)

## 2016-12-03 LAB — I-STAT BETA HCG BLOOD, ED (MC, WL, AP ONLY): I-stat hCG, quantitative: <5 m[IU]/mL (ref <5)

## 2016-12-03 LAB — COMPREHENSIVE METABOLIC PANEL
ALBUMIN: 4 g/dL (ref 3.5–5.0)
ALT: 10 U/L — AB (ref 14–54)
AST: 16 U/L (ref 15–41)
Alkaline Phosphatase: 49 U/L (ref 38–126)
Anion gap: 6 (ref 5–15)
BILIRUBIN TOTAL: 0.5 mg/dL (ref 0.3–1.2)
BUN: 8 mg/dL (ref 6–20)
CO2: 27 mmol/L (ref 22–32)
CREATININE: 0.97 mg/dL (ref 0.44–1.00)
Calcium: 9 mg/dL (ref 8.9–10.3)
Chloride: 103 mmol/L (ref 101–111)
GFR calc Af Amer: >60 mL/min (ref >60)
GLUCOSE: 94 mg/dL (ref 65–99)
POTASSIUM: 3.7 mmol/L (ref 3.5–5.1)
Sodium: 136 mmol/L (ref 135–145)
TOTAL PROTEIN: 7.6 g/dL (ref 6.5–8.1)

## 2016-12-03 LAB — LIPASE, BLOOD: Lipase: 20 U/L (ref 11–51)

## 2016-12-03 MED ORDER — ACETAMINOPHEN 500 MG PO TABS
1000.0000 mg | ORAL_TABLET | Freq: Once | ORAL | Status: AC
  Administered 2016-12-03: 1000 mg via ORAL
  Filled 2016-12-03: qty 2

## 2016-12-03 NOTE — ED Provider Notes (Signed)
[place] DEPT Provider Note   CSN: [id number] Arrival date & time: 12/03/16  1633     History   Chief Complaint Chief Complaint  Patient presents with  . Diarrhea  . Generalized Body Aches  . Cough    HPI Barbara Haas is a 26 y.o. female.  The history is provided by the patient.  Sore Throat  This is a new problem. Episode onset: 3 days ago. The problem occurs constantly. The problem has not changed since onset.Associated symptoms comments: Body aches, diarrhea, cough. Nothing aggravates the symptoms. Nothing relieves the symptoms. She has tried nothing for the symptoms.    Past Medical History:  Diagnosis Date  . Anxiety   . Asthma   . Complication of anesthesia    bp drops  . Endometriosis   . Sickle cell trait [facility])     Patient Active Problem List   Diagnosis Date Noted  . SVD (spontaneous vaginal delivery) 12/22/2015  . Postpartum care following vaginal delivery 12/22/2015  . Indication for care in labor and delivery, antepartum 12/21/2015    Past Surgical History:  Procedure Laterality Date  . OOPHORECTOMY     left  . OVARIAN CYST REMOVAL    . WISDOM TOOTH EXTRACTION      OB History    Gravida Para Term Preterm AB Living   1 1 1     1    SAB TAB Ectopic Multiple Live Births         0 1       Home Medications    Prior to Admission medications   Medication Sig Start Date End Date Taking? Authorizing Provider  ibuprofen (ADVIL,MOTRIN) 800 MG tablet Take 1 tablet (800 mg total) by mouth every 8 (eight) hours as needed. Patient taking differently: Take 800 mg by mouth every 8 (eight) hours as needed for mild pain.  06/28/16  Yes Christopher Lawyer, PA-C  Levonorgestrel-Ethinyl Estradiol (AMETHIA,CAMRESE) 0.15-0.03 &0.01 MG tablet Take 1 tablet by mouth daily. For 3 months 07/15/16  Yes Historical Provider, MD  ondansetron (ZOFRAN ODT) 8 MG disintegrating tablet Take 1 tablet (8 mg total) by mouth every 8 (eight) hours as needed for nausea or  vomiting. 11/18/16  Yes Tatyana Kirichenko, PA-C  butalbital-acetaminophen-caffeine (FIORICET) 50-325-40 MG tablet Take 1-2 tablets by mouth every 6 (six) hours as needed for headache. Patient not taking: Reported on 12/03/2016 12/27/15 12/26/16  Tresea Mall, CNM  loperamide (IMODIUM) 2 MG capsule Take 1 capsule (2 mg total) by mouth 4 (four) times daily as needed for diarrhea or loose stools. Patient not taking: Reported on 12/03/2016 11/18/16   Jeannett Senior, PA-C  promethazine (PHENERGAN) 25 MG tablet Take 0.5-1 tablets (12.5-25 mg total) by mouth every 6 (six) hours as needed. Patient not taking: Reported on 12/03/2016 07/02/16   Tresea Mall, CNM    Family History No family history on file.  Social History Social History  Substance Use Topics  . Smoking status: Never Smoker  . Smokeless tobacco: Never Used  . Alcohol use Yes     Comment: not whiile preg     Allergies   Codeine; Contrast media [iodinated diagnostic agents]; Other; and Shellfish allergy   Review of Systems Review of Systems  All other systems reviewed and are negative.    Physical Exam Updated Vital Signs BP 135/79 (BP Location: Left Arm)   Pulse 109   Temp 98.7 F (37.1 C) (Oral)   Resp 18   Ht 5' 9.5" (1.765 m)  Wt 247 lb (112 kg)   SpO2 99%   BMI 35.95 kg/m   Physical Exam  Constitutional: She is oriented to person, place, and time. She appears well-developed and well-nourished. No distress.  HENT:  Head: Normocephalic.  Nose: Nose normal.  Eyes: Conjunctivae are normal.  Neck: Neck supple. No tracheal deviation present.  Cardiovascular: Normal rate, regular rhythm and normal heart sounds.   Pulmonary/Chest: Effort normal and breath sounds normal. No respiratory distress.  Abdominal: Soft. She exhibits no distension. There is no tenderness. There is no rebound and no guarding.  Neurological: She is alert and oriented to person, place, and time.  Skin: Skin is warm and dry.    Psychiatric: She has a normal mood and affect.  Vitals reviewed.    ED Treatments / Results  Labs (all labs ordered are listed, but only abnormal results are displayed) Labs Reviewed  COMPREHENSIVE METABOLIC PANEL - Abnormal; Notable for the following:       Result Value   ALT 10 (*)    All other components within normal limits  CBC - Abnormal; Notable for the following:    HCT 35.9 (*)    All other components within normal limits  LIPASE, BLOOD  I-STAT BETA HCG BLOOD, ED ([facility], [facility], [facility] ONLY)    EKG  EKG Interpretation None       Radiology No results found.  Procedures Procedures (including critical care time)  Medications Ordered in ED Medications  acetaminophen (TYLENOL) tablet 1,000 mg (1,000 mg Oral Given 12/03/16 2006)     Initial Impression / Assessment and Plan / ED Course  I have reviewed the triage vital signs and the nursing notes.  Pertinent labs & imaging results that were available during my care of the patient were reviewed by me and considered in my medical decision making (see chart for details).     26 y.o. female presents with sore throat, diarrhea. AFVSS here. Well appearing. Dicussed supportive care measures for likely viral illness. Low risk for complications from influenza and well outside of window for tamiflu treatment. Able to tolerate PO. Labs reassuring. Plan to follow up with PCP as needed and return precautions discussed for worsening or new concerning symptoms.   Final Clinical Impressions(s) / ED Diagnoses   Final diagnoses:  Viral URI with cough  Diarrhea, unspecified type    New Prescriptions New Prescriptions   No medications on file     Leo Grosser, MD 12/05/16 0126

## 2016-12-03 NOTE — ED Triage Notes (Signed)
Patient reports having flu week ago and staring on Sunday started having diarrhea, cough with clear phlegm and body aches. Patients adds throat burns.

## 2016-12-04 ENCOUNTER — Encounter (HOSPITAL_COMMUNITY): Payer: Self-pay | Admitting: Emergency Medicine

## 2016-12-04 ENCOUNTER — Emergency Department (HOSPITAL_COMMUNITY): Payer: Medicaid Other

## 2016-12-04 ENCOUNTER — Emergency Department (HOSPITAL_COMMUNITY)
Admission: EM | Admit: 2016-12-04 | Discharge: 2016-12-04 | Disposition: A | Discharge status: Home / Self Care | Payer: Medicaid Other | Attending: Emergency Medicine | Admitting: Emergency Medicine

## 2016-12-04 DIAGNOSIS — J111 Influenza due to unidentified influenza virus with other respiratory manifestations: Secondary | ICD-10-CM | POA: Insufficient documentation

## 2016-12-04 DIAGNOSIS — J45909 Unspecified asthma, uncomplicated: Secondary | ICD-10-CM | POA: Insufficient documentation

## 2016-12-04 LAB — CBC WITH DIFFERENTIAL/PLATELET
Basophils Absolute: 0 10*3/uL (ref 0.0–0.1)
Basophils Relative: 0 %
Eosinophils Absolute: 0 10*3/uL (ref 0.0–0.7)
Eosinophils Relative: 0 %
HCT: 36.3 % (ref 36.0–46.0)
Hemoglobin: 12.5 g/dL (ref 12.0–15.0)
Lymphocytes Relative: 7 %
Lymphs Abs: 0.5 10*3/uL — ABNORMAL LOW (ref 0.7–4.0)
MCH: 28.5 pg (ref 26.0–34.0)
MCHC: 34.4 g/dL (ref 30.0–36.0)
MCV: 82.7 fL (ref 78.0–100.0)
Monocytes Absolute: 0.5 10*3/uL (ref 0.1–1.0)
Monocytes Relative: 7 %
Neutro Abs: 6.2 10*3/uL (ref 1.7–7.7)
Neutrophils Relative %: 86 %
Platelets: 190 10*3/uL (ref 150–400)
RBC: 4.39 MIL/uL (ref 3.87–5.11)
RDW: 13.5 % (ref 11.5–15.5)
WBC: 7.3 10*3/uL (ref 4.0–10.5)

## 2016-12-04 LAB — COMPREHENSIVE METABOLIC PANEL
ALT: 11 U/L — ABNORMAL LOW (ref 14–54)
AST: 21 U/L (ref 15–41)
Albumin: 4 g/dL (ref 3.5–5.0)
Alkaline Phosphatase: 47 U/L (ref 38–126)
Anion gap: 6 (ref 5–15)
BUN: 7 mg/dL (ref 6–20)
CO2: 25 mmol/L (ref 22–32)
Calcium: 9.1 mg/dL (ref 8.9–10.3)
Chloride: 106 mmol/L (ref 101–111)
Creatinine, Ser: 1.02 mg/dL — ABNORMAL HIGH (ref 0.44–1.00)
GFR calc Af Amer: >60 mL/min (ref >60)
GFR calc non Af Amer: >60 mL/min (ref >60)
Glucose, Bld: 95 mg/dL (ref 65–99)
Potassium: 4.1 mmol/L (ref 3.5–5.1)
Sodium: 137 mmol/L (ref 135–145)
Total Bilirubin: 0.8 mg/dL (ref 0.3–1.2)
Total Protein: 8.1 g/dL (ref 6.5–8.1)

## 2016-12-04 MED ORDER — OSELTAMIVIR PHOSPHATE 75 MG PO CAPS: 75.0000 mg | ORAL_CAPSULE | Freq: Two times a day (BID) | ORAL | 0 refills | Status: DC

## 2016-12-04 MED ORDER — SODIUM CHLORIDE 0.9 % IV BOLUS (SEPSIS)
1000.0000 mL | Freq: Once | INTRAVENOUS | Status: AC
  Administered 2016-12-04: 1000 mL via INTRAVENOUS

## 2016-12-04 MED ORDER — IBUPROFEN 800 MG PO TABS
800.0000 mg | ORAL_TABLET | Freq: Once | ORAL | Status: AC
  Administered 2016-12-04: 800 mg via ORAL
  Filled 2016-12-04: qty 1

## 2016-12-04 MED ORDER — ACETAMINOPHEN 325 MG PO TABS
650.0000 mg | ORAL_TABLET | Freq: Once | ORAL | Status: AC
  Administered 2016-12-04: 650 mg via ORAL
  Filled 2016-12-04: qty 2

## 2016-12-04 MED ORDER — IPRATROPIUM-ALBUTEROL 0.5-2.5 (3) MG/3ML IN SOLN
3.0000 mL | Freq: Once | RESPIRATORY_TRACT | Status: AC
  Administered 2016-12-04: 3 mL via RESPIRATORY_TRACT
  Filled 2016-12-04: qty 3

## 2016-12-04 NOTE — ED Notes (Signed)
Bed: WA19 Expected date:  Expected time:  Means of arrival:  Comments: 

## 2016-12-04 NOTE — Discharge Instructions (Signed)
Please read attached information. If you experience any new or worsening signs or symptoms please return to the emergency room for evaluation. Please follow-up with your primary care provider or specialist as discussed. Please use medication prescribed only as directed and discontinue taking if you have any concerning signs or symptoms.  Please continue using Tylenol and ibuprofen as needed for discomfort.

## 2016-12-04 NOTE — ED Notes (Signed)
PT DISCHARGED. INSTRUCTIONS AND PRESCRIPTION GIVEN. AAOX4. PT IN NO APPARENT DISTRESS. THE OPPORTUNITY TO ASK QUESTIONS WAS PROVIDED. 

## 2016-12-04 NOTE — ED Triage Notes (Signed)
Patient was seen yesterday for flu like symptoms, cough, body aches and sore throat. She was administered over the counter medication but doesn't feel like anything is working.

## 2016-12-04 NOTE — ED Provider Notes (Signed)
[place] DEPT Provider Note   CSN: [id number] Arrival date & time: 12/04/16  1045  By signing my name below, I, Higinio Plan, attest that this documentation has been prepared under the direction and in the presence of [employer], PA-C . Electronically Signed: Higinio Plan, Scribe. 12/04/2016. 11:39 AM.  History   Chief Complaint Chief Complaint  Patient presents with  . Nasal Congestion  . Weakness   The history is provided by the patient. No language interpreter was used.   HPI Comments: Barbara Haas is a 26 y.o. female with PMHx of asthma, who presents to the Emergency Department complaining of gradually worsening, generalized body aches, sore throat, and cough that began ~3 days ago and worsened today. Pt reports she visited the ED on 11/18/16 for flu-like symptoms including cough, congestion, nausea, vomiting, and diarrhea in which she was treated with symptomatic treatment that provided moderate relief. She states her symptoms gradually improved before worsening yesterday when she visited the ED for persistent, generalized body aches and cough. She notes she was told to continue taking Mucinex and Ibuprofen 800 mg that provided no relief. Pt returned to the ED today complaining of a "throbbing," headache that began this morning, fever (TMAX 102.9) and persistent, chest tightness and numbness in her bilateral legs. She notes associated cough and multiple episodes of vomiting in which she is unable to keep and fluids or foods down and shortness of breath that is exacerbated when taking a deep breath.   Past Medical History:  Diagnosis Date  . Anxiety   . Asthma   . Complication of anesthesia    bp drops  . Endometriosis   . Sickle cell trait [facility])    Patient Active Problem List   Diagnosis Date Noted  . SVD (spontaneous vaginal delivery) 12/22/2015  . Postpartum care following vaginal delivery 12/22/2015  . Indication for care in labor and delivery, antepartum 12/21/2015     Past Surgical History:  Procedure Laterality Date  . OOPHORECTOMY     left  . OVARIAN CYST REMOVAL    . WISDOM TOOTH EXTRACTION     OB History    Gravida Para Term Preterm AB Living   1 1 1     1    SAB TAB Ectopic Multiple Live Births         0 1     Home Medications    Prior to Admission medications   Medication Sig Start Date End Date Taking? Authorizing Provider  ibuprofen (ADVIL,MOTRIN) 800 MG tablet Take 800 mg by mouth every 8 (eight) hours as needed for fever, headache, mild pain or moderate pain.   Yes Historical Provider, MD  Levonorgestrel-Ethinyl Estradiol (AMETHIA,CAMRESE) 0.15-0.03 &0.01 MG tablet Take 1 tablet by mouth daily.    Yes Historical Provider, MD  ondansetron (ZOFRAN ODT) 8 MG disintegrating tablet Take 1 tablet (8 mg total) by mouth every 8 (eight) hours as needed for nausea or vomiting. 11/18/16  Yes Tatyana Kirichenko, PA-C  oseltamivir (TAMIFLU) 75 MG capsule Take 1 capsule (75 mg total) by mouth every 12 (twelve) hours. 12/04/16   Okey Regal, PA-C    Family History No family history on file.  Social History Social History  Substance Use Topics  . Smoking status: Never Smoker  . Smokeless tobacco: Never Used  . Alcohol use Yes     Comment: not whiile preg   Allergies   Shellfish allergy; Codeine; Contrast media [iodinated diagnostic agents]; and Other  Review of Systems Review of Systems  Constitutional: Positive for fever.  HENT: Positive for sore throat.   Respiratory: Positive for cough, chest tightness and shortness of breath.   Gastrointestinal: Positive for nausea and vomiting.  Musculoskeletal: Positive for myalgias.  Neurological: Positive for numbness and headaches.   Physical Exam Updated Vital Signs BP 133/83 (BP Location: Right Arm)   Pulse 115   Temp 102.9 F (39.4 C) (Oral)   Resp 14   Ht 5\' 9"  (1.753 m)   Wt 247 lb (112 kg)   SpO2 97%   BMI 36.48 kg/m   Physical Exam  Constitutional: She is oriented to  person, place, and time. She appears well-developed and well-nourished. No distress.  HENT:  Head: Normocephalic and atraumatic.  Nose: Rhinorrhea present.  Mouth/Throat: Oropharynx is clear and moist.  Eyes: EOM are normal.  Neck: Normal range of motion. Neck supple.  Cardiovascular: Tachycardia present.   Pulmonary/Chest: Effort normal and breath sounds normal.  Lungs clear to auscultation bilaterally   Abdominal: Soft. She exhibits no distension. There is no tenderness.  Musculoskeletal: Normal range of motion.  Neurological: She is alert and oriented to person, place, and time.  Skin: Skin is warm and dry.  Psychiatric: She has a normal mood and affect. Judgment normal.  Nursing note and vitals reviewed.  ED Treatments / Results  DIAGNOSTIC STUDIES:  Oxygen Saturation is 97% on RA, normal by my interpretation.    COORDINATION OF CARE:  11:20 AM Discussed treatment plan with pt at bedside and pt agreed to plan.  Labs (all labs ordered are listed, but only abnormal results are displayed) Labs Reviewed  CBC WITH DIFFERENTIAL/PLATELET - Abnormal; Notable for the following:       Result Value   Lymphs Abs 0.5 (*)    All other components within normal limits  COMPREHENSIVE METABOLIC PANEL - Abnormal; Notable for the following:    Creatinine, Ser 1.02 (*)    ALT 11 (*)    All other components within normal limits    EKG  EKG Interpretation None       Radiology Dg Chest 2 View  Result Date: 12/04/2016 CLINICAL DATA:  Cough and sore throat EXAM: CHEST  2 VIEW COMPARISON:  Chest radiograph and chest CT October 15, 2016 FINDINGS: Lungs are clear. Heart size and pulmonary vascularity are normal. No adenopathy. No pneumothorax. No bone lesions. IMPRESSION: No edema or consolidation. Electronically Signed   By: Lowella Grip III M.D.   On: 12/04/2016 11:21    Procedures Procedures (including critical care time)  Medications Ordered in ED Medications  acetaminophen  (TYLENOL) tablet 650 mg (650 mg Oral Given 12/04/16 1232)  sodium chloride 0.9 % bolus 1,000 mL (0 mLs Intravenous Stopped 12/04/16 1444)  ipratropium-albuterol (DUONEB) 0.5-2.5 (3) MG/3ML nebulizer solution 3 mL (3 mLs Nebulization Given 12/04/16 1334)  ibuprofen (ADVIL,MOTRIN) tablet 800 mg (800 mg Oral Given 12/04/16 1445)   Initial Impression / Assessment and Plan / ED Course  I have reviewed the triage vital signs and the nursing notes.  Pertinent labs & imaging results that were available during my care of the patient were reviewed by me and considered in my medical decision making (see chart for details).  Labs:   Imaging: DG Chest   Consults:   Therapeutics: Normal saline, Tylenol, ibuprofen  Discharge Meds: Tamiflu  Assessment/Plan:   26 year old female presents today with likely influenza.  Patient appears well but was tachycardic with an elevated temperature.  She had clear lung sounds, negative chest x-ray.  She  was given antipyretics and normal saline here.  Patient had persistent fever but reduced, she was no longer tachycardic and visibly appeared very well.  Patient otherwise healthy with no significant past medical history that would necessitate further evaluation.  She will be started on Tamiflu, encouraged follow-up with primary care in 2 days, return to the emergency room if she has any new or worsening signs or symptoms.  Patient verbalized understanding and agreement to today's plan and had no further questions or concerns at the time of discharge  I personally performed the services described in this documentation, which was scribed in my presence. The recorded information has been reviewed and is accurate.  Final Clinical Impressions(s) / ED Diagnoses   Final diagnoses:  Influenza    New Prescriptions Discharge Medication List as of 12/04/2016  2:30 PM    START taking these medications   Details  oseltamivir (TAMIFLU) 75 MG capsule Take 1 capsule (75 mg total)  by mouth every 12 (twelve) hours., Starting Wed 12/04/2016, Print         [employer], PA-C 12/04/16 [place], MD 12/06/16 [phone]

## 2016-12-07 ENCOUNTER — Emergency Department (HOSPITAL_COMMUNITY)
Admission: EM | Admit: 2016-12-07 | Discharge: 2016-12-07 | Disposition: A | Discharge status: Home / Self Care | Payer: Medicaid Other | Attending: Emergency Medicine | Admitting: Emergency Medicine

## 2016-12-07 DIAGNOSIS — J45909 Unspecified asthma, uncomplicated: Secondary | ICD-10-CM | POA: Insufficient documentation

## 2016-12-07 DIAGNOSIS — Z79899 Other long term (current) drug therapy: Secondary | ICD-10-CM | POA: Insufficient documentation

## 2016-12-07 DIAGNOSIS — J069 Acute upper respiratory infection, unspecified: Secondary | ICD-10-CM | POA: Insufficient documentation

## 2016-12-07 MED ORDER — ALBUTEROL SULFATE HFA 108 (90 BASE) MCG/ACT IN AERS
1.0000 | INHALATION_SPRAY | Freq: Once | RESPIRATORY_TRACT | Status: AC
  Administered 2016-12-07: 1 via RESPIRATORY_TRACT
  Filled 2016-12-07: qty 6.7

## 2016-12-07 MED ORDER — AZITHROMYCIN 250 MG PO TABS: 250.0000 mg | ORAL_TABLET | Freq: Every day | ORAL | 0 refills | Status: DC

## 2016-12-07 NOTE — Discharge Instructions (Signed)
Please read and follow all provided instructions.  Your diagnoses today include:  1. Upper respiratory tract infection, unspecified type     Tests performed today include: Vital signs. See below for your results today.   Medications prescribed:  Take as prescribed   Home care instructions:  Follow any educational materials contained in this packet.  Follow-up instructions: Please follow-up with your primary care provider for further evaluation of symptoms and treatment   Return instructions:  Please return to the Emergency Department if you do not get better, if you get worse, or new symptoms OR  - Fever (temperature greater than 101.39F)  - Bleeding that does not stop with holding pressure to the area    -Severe pain (please note that you may be more sore the day after your accident)  - Chest Pain  - Difficulty breathing  - Severe nausea or vomiting  - Inability to tolerate food and liquids  - Passing out  - Skin becoming red around your wounds  - Change in mental status (confusion or lethargy)  - New numbness or weakness    Please return if you have any other emergent concerns.  Additional Information:  Your vital signs today were: BP 125/81    Pulse 93    Temp 98.4 F (36.9 C)    Resp 16    SpO2 100%  If your blood pressure (BP) was elevated above 135/85 this visit, please have this repeated by your doctor within one month. ---------------

## 2016-12-07 NOTE — ED Triage Notes (Signed)
Per pt, has been seen for the same symptoms for 3 days-states meds are not working-was diagnosed with the flu-states she passed it on to her daughter-sore throat, cough, congestion

## 2016-12-07 NOTE — ED Provider Notes (Signed)
[place] DEPT Provider Note   By signing my name below, I, Bea Graff, attest that this documentation has been prepared under the direction and in the presence of Shary Decamp, PA-C. Electronically Signed: Bea Graff, ED Scribe. 12/07/16. 11:29 AM.    History   Chief Complaint Chief Complaint  Patient presents with  . flu like symptoms    The history is provided by the patient and medical records. No language interpreter was used.    Barbara Haas is an obese 26 y.o. female with PMHx of asthma who presents to the Emergency Department complaining of flu like symptoms that began approximately three weeks ago. She reports associated intermittently dry/intermittently productive cough of phlegm, congestion, chills, body aches, nausea, vomiting. Pt was seen four days ago and diagnosed with a viral URI. She was seen three days ago and had negative labs, a negative CXR and prescribed Tamiflu. She was initially seen about three weeks ago for this issue and prescribed Zofran. She states the Tamiflu makes her mouth itch but reports taking it as directed as well as Robitussin with some relief. Pt reports she has been taking Zofran but it does not help. She denies modifying factors. She denies fever.   Past Medical History:  Diagnosis Date  . Anxiety   . Asthma   . Complication of anesthesia    bp drops  . Endometriosis   . Sickle cell trait [facility])     Patient Active Problem List   Diagnosis Date Noted  . SVD (spontaneous vaginal delivery) 12/22/2015  . Postpartum care following vaginal delivery 12/22/2015  . Indication for care in labor and delivery, antepartum 12/21/2015    Past Surgical History:  Procedure Laterality Date  . OOPHORECTOMY     left  . OVARIAN CYST REMOVAL    . WISDOM TOOTH EXTRACTION      OB History    Gravida Para Term Preterm AB Living   1 1 1     1    SAB TAB Ectopic Multiple Live Births         0 1       Home Medications    Prior to  Admission medications   Medication Sig Start Date End Date Taking? Authorizing Provider  azithromycin (ZITHROMAX) 250 MG tablet Take 1 tablet (250 mg total) by mouth daily. Take first 2 tablets together, then 1 every day until finished. 12/07/16   Shary Decamp, PA-C  ibuprofen (ADVIL,MOTRIN) 800 MG tablet Take 800 mg by mouth every 8 (eight) hours as needed for fever, headache, mild pain or moderate pain.    Historical Provider, MD  Levonorgestrel-Ethinyl Estradiol (AMETHIA,CAMRESE) 0.15-0.03 &0.01 MG tablet Take 1 tablet by mouth daily.     Historical Provider, MD  ondansetron (ZOFRAN ODT) 8 MG disintegrating tablet Take 1 tablet (8 mg total) by mouth every 8 (eight) hours as needed for nausea or vomiting. 11/18/16   Tatyana Kirichenko, PA-C  oseltamivir (TAMIFLU) 75 MG capsule Take 1 capsule (75 mg total) by mouth every 12 (twelve) hours. 12/04/16   Okey Regal, PA-C    Family History No family history on file.  Social History Social History  Substance Use Topics  . Smoking status: Never Smoker  . Smokeless tobacco: Never Used  . Alcohol use Yes     Comment: not whiile preg     Allergies   Shellfish allergy; Codeine; Contrast media [iodinated diagnostic agents]; and Other   Review of Systems Review of Systems  Constitutional: Positive for chills. Negative for  fever.  HENT: Positive for congestion.   Respiratory: Positive for cough.   Gastrointestinal: Positive for nausea and vomiting.  Musculoskeletal: Positive for myalgias.     Physical Exam Updated Vital Signs BP 125/81   Pulse 93   Temp 98.4 F (36.9 C)   Resp 16   SpO2 100%   Physical Exam  Constitutional: She is oriented to person, place, and time. She appears well-developed and well-nourished. No distress.  HENT:  Head: Normocephalic and atraumatic.  Right Ear: Tympanic membrane, external ear and ear canal normal.  Left Ear: Tympanic membrane, external ear and ear canal normal.  Nose: Nose normal.    Mouth/Throat: Uvula is midline, oropharynx is clear and moist and mucous membranes are normal. No trismus in the jaw. No oropharyngeal exudate, posterior oropharyngeal erythema or tonsillar abscesses.  Eyes: EOM are normal. Pupils are equal, round, and reactive to light.  Neck: Normal range of motion. Neck supple. No tracheal deviation present.  Cardiovascular: Normal rate, regular rhythm, S1 normal, S2 normal, normal heart sounds, intact distal pulses and normal pulses.   Pulmonary/Chest: Effort normal and breath sounds normal. No respiratory distress. She has no decreased breath sounds. She has no wheezes. She has no rhonchi. She has no rales.  Abdominal: Normal appearance and bowel sounds are normal. There is no tenderness.  Musculoskeletal: Normal range of motion.  Neurological: She is alert and oriented to person, place, and time.  Skin: Skin is warm and dry.  Psychiatric: She has a normal mood and affect. Her speech is normal and behavior is normal. Thought content normal.     ED Treatments / Results  DIAGNOSTIC STUDIES: Oxygen Saturation is 100% on RA, normal by my interpretation.   COORDINATION OF CARE: 11:24 AM- Will prescribe MDI and Z-Pak. Advised pt to follow up with PCP for continued symptoms. Pt verbalizes understanding and agrees to plan.  Medications  albuterol (PROVENTIL HFA;VENTOLIN HFA) 108 (90 Base) MCG/ACT inhaler 1 puff (not administered)    Labs (all labs ordered are listed, but only abnormal results are displayed) Labs Reviewed - No data to display  EKG  EKG Interpretation None       Radiology No results found.  Procedures Procedures (including critical care time)  Medications Ordered in ED Medications  albuterol (PROVENTIL HFA;VENTOLIN HFA) 108 (90 Base) MCG/ACT inhaler 1 puff (not administered)     Initial Impression / Assessment and Plan / ED Course  I have reviewed the triage vital signs and the nursing notes.  Pertinent labs & imaging  results that were available during my care of the patient were reviewed by me and considered in my medical decision making (see chart for details).     Final Clinical Impressions(s) / ED Diagnoses  I have reviewed the relevant previous healthcare records. I obtained HPI from historian.  ED Course:  Assessment: Pt symptoms consistent with URI. He for same x 4 in the past month with symptoms beginning the first of February. Unremarkable work ups including CXRs and blood work. Pt will be discharged with symptomatic treatment, prescription for Z-Pak and an albuterol MDI.  Discussed return precautions. Pt is hemodynamically stable & in NAD prior to discharge.   Disposition/Plan:  DC Home Additional Verbal discharge instructions given and discussed with patient.  Pt Instructed to f/u with PCP in the next week for evaluation and treatment of symptoms. Return precautions given Pt acknowledges and agrees with plan  Supervising Physician Drenda Freeze, MD  Final diagnoses:  Upper respiratory tract  infection, unspecified type   I personally performed the services described in this documentation, which was scribed in my presence. The recorded information has been reviewed and is accurate.   New Prescriptions New Prescriptions   AZITHROMYCIN (ZITHROMAX) 250 MG TABLET    Take 1 tablet (250 mg total) by mouth daily. Take first 2 tablets together, then 1 every day until finished.     Shary Decamp, PA-C 12/07/16 [place], MD 12/07/16 [phone]

## 2017-02-24 ENCOUNTER — Emergency Department (HOSPITAL_COMMUNITY): Payer: Medicaid Other

## 2017-02-24 ENCOUNTER — Encounter (HOSPITAL_COMMUNITY): Payer: Self-pay | Admitting: Radiology

## 2017-02-24 ENCOUNTER — Emergency Department (HOSPITAL_COMMUNITY)
Admission: EM | Admit: 2017-02-24 | Discharge: 2017-02-24 | Disposition: A | Discharge status: Home / Self Care | Payer: Medicaid Other | Attending: Emergency Medicine | Admitting: Emergency Medicine

## 2017-02-24 DIAGNOSIS — R0789 Other chest pain: Secondary | ICD-10-CM | POA: Diagnosis not present

## 2017-02-24 DIAGNOSIS — J45909 Unspecified asthma, uncomplicated: Secondary | ICD-10-CM | POA: Insufficient documentation

## 2017-02-24 DIAGNOSIS — R202 Paresthesia of skin: Secondary | ICD-10-CM | POA: Diagnosis not present

## 2017-02-24 DIAGNOSIS — O9989 Other specified diseases and conditions complicating pregnancy, childbirth and the puerperium: Secondary | ICD-10-CM | POA: Insufficient documentation

## 2017-02-24 DIAGNOSIS — O0281 Inappropriate change in quantitative human chorionic gonadotropin (hCG) in early pregnancy: Secondary | ICD-10-CM | POA: Diagnosis not present

## 2017-02-24 DIAGNOSIS — Z3A01 Less than 8 weeks gestation of pregnancy: Secondary | ICD-10-CM | POA: Diagnosis not present

## 2017-02-24 DIAGNOSIS — O26891 Other specified pregnancy related conditions, first trimester: Secondary | ICD-10-CM | POA: Diagnosis present

## 2017-02-24 DIAGNOSIS — Z79899 Other long term (current) drug therapy: Secondary | ICD-10-CM | POA: Insufficient documentation

## 2017-02-24 LAB — COMPREHENSIVE METABOLIC PANEL
ALT: 9 U/L — ABNORMAL LOW (ref 14–54)
ANION GAP: 7 (ref 5–15)
AST: 15 U/L (ref 15–41)
Albumin: 3.4 g/dL — ABNORMAL LOW (ref 3.5–5.0)
Alkaline Phosphatase: 48 U/L (ref 38–126)
BILIRUBIN TOTAL: 0.5 mg/dL (ref 0.3–1.2)
BUN: 7 mg/dL (ref 6–20)
CHLORIDE: 104 mmol/L (ref 101–111)
CO2: 23 mmol/L (ref 22–32)
Calcium: 8.6 mg/dL — ABNORMAL LOW (ref 8.9–10.3)
Creatinine, Ser: 0.77 mg/dL (ref 0.44–1.00)
GFR calc Af Amer: >60 mL/min (ref >60)
GFR calc non Af Amer: >60 mL/min (ref >60)
GLUCOSE: 90 mg/dL (ref 65–99)
Potassium: 3.6 mmol/L (ref 3.5–5.1)
Sodium: 134 mmol/L — ABNORMAL LOW (ref 135–145)
TOTAL PROTEIN: 6.6 g/dL (ref 6.5–8.1)

## 2017-02-24 LAB — CBC WITH DIFFERENTIAL/PLATELET
Basophils Absolute: 0 10*3/uL (ref 0.0–0.1)
Basophils Relative: 0 %
EOS ABS: 0.1 10*3/uL (ref 0.0–0.7)
EOS PCT: 1 %
HCT: 33.7 % — ABNORMAL LOW (ref 36.0–46.0)
Hemoglobin: 11.4 g/dL — ABNORMAL LOW (ref 12.0–15.0)
Lymphocytes Relative: 23 %
Lymphs Abs: 1.6 10*3/uL (ref 0.7–4.0)
MCH: 28.1 pg (ref 26.0–34.0)
MCHC: 33.8 g/dL (ref 30.0–36.0)
MCV: 83 fL (ref 78.0–100.0)
MONO ABS: 0.3 10*3/uL (ref 0.1–1.0)
MONOS PCT: 5 %
Neutro Abs: 4.9 10*3/uL (ref 1.7–7.7)
Neutrophils Relative %: 71 %
PLATELETS: 219 10*3/uL (ref 150–400)
RBC: 4.06 MIL/uL (ref 3.87–5.11)
RDW: 14.8 % (ref 11.5–15.5)
WBC: 6.9 10*3/uL (ref 4.0–10.5)

## 2017-02-24 LAB — URINALYSIS, ROUTINE W REFLEX MICROSCOPIC
Bacteria, UA: NONE SEEN
Bilirubin Urine: NEGATIVE
GLUCOSE, UA: NEGATIVE mg/dL
HGB URINE DIPSTICK: NEGATIVE
KETONES UR: NEGATIVE mg/dL
Nitrite: NEGATIVE
PROTEIN: NEGATIVE mg/dL
Specific Gravity, Urine: 1.016 (ref 1.005–1.030)
pH: 5 (ref 5.0–8.0)

## 2017-02-24 LAB — HCG, QUANTITATIVE, PREGNANCY: HCG, BETA CHAIN, QUANT, S: 115132 m[IU]/mL — AB (ref <5)

## 2017-02-24 LAB — I-STAT TROPONIN, ED: TROPONIN I, POC: 0 ng/mL (ref 0.00–0.08)

## 2017-02-24 NOTE — ED Provider Notes (Signed)
[facility] DEPT Provider Note   CSN: [id number] Arrival date & time: 02/24/17  1148     History   Chief Complaint Chief Complaint  Patient presents with  . Tingling  . Near Syncope    HPI Barbara Haas is a 26 y.o. female G3 P1 011 presenting with 2 weeks of dizzy spells and tingling in her feet and today with right-sided non-radiating chest discomfort. She explains that it feels as though someone punched her in the chest and is worse when she lays flat and better when she sits up. She explains that she has had the same exact symptoms other than the chest pain back in January and had a CT of her chest. She was referred to a pulmonologist but did not follow-up due to insurance status. She reports talking to her PCP regarding the tingling in her feet and she ran some tests to check for protein in her urine. Her first OB appointment is on the 25 th and she is concerned that her blood pressure is elevated that she has had a history of preeclampsia. She reports some nausea and vomiting, shortness of breath worse when she lays down. She states that the dizziness she has been experiencing is worse with head movement and describes it as the room moving around her. No accompanying loss of hearing. She has tried albuterol with relief of the shortness of breath but the pain in her right chest still there. It is achy and tender to palpation. She denies vaginal bleeding, discharge, dysuria, hematuria, diarrhea, fever, chills or other symptoms.  HPI  Past Medical History:  Diagnosis Date  . Anxiety   . Asthma   . Complication of anesthesia    bp drops  . Endometriosis   . Sickle cell trait [facility])     Patient Active Problem List   Diagnosis Date Noted  . SVD (spontaneous vaginal delivery) 12/22/2015  . Postpartum care following vaginal delivery 12/22/2015  . Indication for care in labor and delivery, antepartum 12/21/2015    Past Surgical History:  Procedure Laterality Date  .  OOPHORECTOMY     left  . OVARIAN CYST REMOVAL    . WISDOM TOOTH EXTRACTION      OB History    Gravida Para Term Preterm AB Living   2 1 1     1    SAB TAB Ectopic Multiple Live Births         0 1       Home Medications    Prior to Admission medications   Medication Sig Start Date End Date Taking? Authorizing Provider  azithromycin (ZITHROMAX) 250 MG tablet Take 1 tablet (250 mg total) by mouth daily. Take first 2 tablets together, then 1 every day until finished. 12/07/16   Shary Decamp, PA-C  ibuprofen (ADVIL,MOTRIN) 800 MG tablet Take 800 mg by mouth every 8 (eight) hours as needed for fever, headache, mild pain or moderate pain.    [provider]  Levonorgestrel-Ethinyl Estradiol (AMETHIA,CAMRESE) 0.15-0.03 &0.01 MG tablet Take 1 tablet by mouth daily.     [provider]  ondansetron (ZOFRAN ODT) 8 MG disintegrating tablet Take 1 tablet (8 mg total) by mouth every 8 (eight) hours as needed for nausea or vomiting. 11/18/16   Kirichenko, Lahoma Rocker, PA-C  oseltamivir (TAMIFLU) 75 MG capsule Take 1 capsule (75 mg total) by mouth every 12 (twelve) hours. 12/04/16   Okey Regal, PA-C    Family History No family history on file.  Social History Social  History  Substance Use Topics  . Smoking status: Never Smoker  . Smokeless tobacco: Never Used  . Alcohol use Yes     Comment: not whiile preg     Allergies   Shellfish allergy; Codeine; Contrast media [iodinated diagnostic agents]; and Other   Review of Systems Review of Systems  Constitutional: Negative for chills and fever.  HENT: Negative for ear pain, facial swelling, sore throat and tinnitus.   Eyes: Negative for pain and visual disturbance.  Respiratory: Negative for cough and shortness of breath.   Cardiovascular: Negative for chest pain and palpitations.  Gastrointestinal: Positive for nausea and vomiting. Negative for abdominal distention, abdominal pain and blood in stool.  Genitourinary:  Negative for difficulty urinating, dysuria, flank pain, frequency, hematuria, pelvic pain, urgency, vaginal bleeding and vaginal discharge.  Musculoskeletal: Negative for arthralgias, back pain, gait problem, joint swelling, myalgias, neck pain and neck stiffness.  Skin: Negative for color change, pallor and rash.  Neurological: Positive for dizziness and numbness. Negative for tremors, seizures, syncope, facial asymmetry, speech difficulty, weakness, light-headedness and headaches.     Physical Exam Updated Vital Signs BP (!) 108/54 (BP Location: Right Arm)   Pulse 65   Temp 98.6 F (37 C) (Oral)   Resp 15   Ht 5\' 10"  (1.778 m)   Wt 113.9 kg   LMP 10/07/2016   SpO2 96%   BMI 36.01 kg/m   Physical Exam  Constitutional: She is oriented to person, place, and time. She appears well-developed and well-nourished. No distress.  HENT:  Head: Normocephalic and atraumatic.  Mouth/Throat: Oropharynx is clear and moist. No oropharyngeal exudate.  Eyes: Conjunctivae and EOM are normal. Pupils are equal, round, and reactive to light. Right eye exhibits no discharge. Left eye exhibits no discharge. No scleral icterus.  Neck: Normal range of motion. Neck supple.  Cardiovascular: Normal rate, regular rhythm, normal heart sounds and intact distal pulses.   No murmur heard. Pulmonary/Chest: Effort normal and breath sounds normal. No respiratory distress. She has no wheezes. She has no rales. She exhibits tenderness.  Lungs are clear to auscultation bilaterally. Patient has tenderness palpation of the right upper chest. No ecchymosis or rash  Abdominal: Soft. Bowel sounds are normal. She exhibits no distension and no mass. There is no tenderness. There is no rebound and no guarding.  Musculoskeletal: Normal range of motion. She exhibits no edema or tenderness.  Neurological: She is alert and oriented to person, place, and time. No cranial nerve deficit or sensory deficit. She exhibits normal muscle  tone. Coordination normal.  Neurologic Exam:   - Mental status: Patient is alert and cooperative. Fluent speech and words are clear. Coherent thought processes and insight is good. Patient is oriented x 4 to person, place, time and event.  - Cranial nerves:  CN III, IV, VI: pupils equally round, reactive to light both direct and conscensual and normal accommodation. Full extra-ocular movement. CN VII : muscles of facial expression intact. CN X :  midline uvula. XI strength of sternocleidomastoid and trapezius muscles 5/5, XII: tongue is midline when protruded. - Motor: No involuntary movements. Muscle tone and bulk normal throughout. Muscle strength is 5/5 in bilateral shoulder abduction, elbow flexion and extension, grip, hip extension, flexion, leg flexion and extension, ankle dorsiflexion and plantar flexion.  - Sensory: Proprioception, light tough sensation intact in all extremities.  - Cerebellar: rapid alternating movements and point to point movement intact in upper and lower extremities. Normal stance and gait.   Skin: Skin  is warm and dry. No rash noted. She is not diaphoretic. No erythema. No pallor.  Psychiatric: She has a normal mood and affect.  Nursing note and vitals reviewed.    ED Treatments / Results  Labs (all labs ordered are listed, but only abnormal results are displayed) Labs Reviewed  HCG, QUANTITATIVE, PREGNANCY - Abnormal; Notable for the following:       Result Value   hCG, Beta Chain, Quant, S 115,132 (*)    All other components within normal limits  CBC WITH DIFFERENTIAL/PLATELET - Abnormal; Notable for the following:    Hemoglobin 11.4 (*)    HCT 33.7 (*)    All other components within normal limits  COMPREHENSIVE METABOLIC PANEL - Abnormal; Notable for the following:    Sodium 134 (*)    Calcium 8.6 (*)    Albumin 3.4 (*)    ALT 9 (*)    All other components within normal limits  URINALYSIS, ROUTINE W REFLEX MICROSCOPIC - Abnormal; Notable for the  following:    APPearance HAZY (*)    Leukocytes, UA TRACE (*)    Squamous Epithelial / LPF 0-5 (*)    All other components within normal limits  I-STAT TROPOININ, ED    EKG  EKG Interpretation None       Radiology Dg Chest 2 View  Result Date: 02/24/2017 CLINICAL DATA:  Right-sided chest pain. Thrombotic thrombocytopenic purpura. Sickle cell trait. EXAM: CHEST  2 VIEW COMPARISON:  12/04/2016 FINDINGS: The heart size and mediastinal contours are within normal limits. Both lungs are clear. The visualized skeletal structures are unremarkable. IMPRESSION: No active cardiopulmonary disease. Electronically Signed   By: Earle Gell M.D.   On: 02/24/2017 14:10    Procedures Procedures (including critical care time)  Medications Ordered in ED Medications - No data to display   Initial Impression / Assessment and Plan / ED Course  I have reviewed the triage vital signs and the nursing notes.  Pertinent labs & imaging results that were available during my care of the patient were reviewed by me and considered in my medical decision making (see chart for details).    Patient presents via EMS after an episode of dizziness, hand and feet tingling and right-sided chest pain. Troponin, chest x-ray negative. Urine without protein and blood pressure has been stable. She is well-appearing nontoxic and afebrile.  Urged patient to follow-up with PCP and proceed with her referral with pulmonology for incidental finding of nodules in the right lung 4 months ago. Exams otherwise reassuring, lungs CTA bilaterally, normal neuro exam. No focal deficits. Labs unremarkable.  Discharge home with close PCP follow-up in OB appointment in a week.  Discussed strict return precautions and advised to return to the emergency department if experiencing any new or worsening symptoms. Instructions were understood and patient agreed with discharge plan.  Final Clinical Impressions(s) / ED Diagnoses   Final  diagnoses:  Tingling in extremities  Other chest pain    New Prescriptions New Prescriptions   No medications on file     Dossie Der 02/24/17 1543    Mesner, Corene Cornea, MD 02/25/17 [phone]

## 2017-02-24 NOTE — ED Triage Notes (Addendum)
Patient comes in per [facility] with chest tingling, finger tingling, and feet tingling for past few weeks associated with dizziness. [redacted] weeks pregnant. Patient states her bp has been elevated during pregnancy. Patient has had 1 pregnancy she carried to term and 1 miscarriage. Hx of preeclampsia. Nausea noted. Ems v/s 114/77, 86 HR, 99% RA, 116 cbg.

## 2017-02-24 NOTE — ED Notes (Signed)
EDP at bedside  

## 2017-02-24 NOTE — Discharge Instructions (Signed)
As discussed, please follow-up with your pulmonologist referral. Call your PCP today. Stay well hydrated.  Return to the emergency department if the pain increases, experience shortness of breath, nausea, vomiting, or any other new concerning symptoms in the meantime.

## 2017-03-11 ENCOUNTER — Encounter (HOSPITAL_COMMUNITY): Payer: Self-pay | Admitting: *Deleted

## 2017-03-11 ENCOUNTER — Inpatient Hospital Stay (HOSPITAL_COMMUNITY)
Admission: AD | Admit: 2017-03-11 | Discharge: 2017-03-11 | Disposition: A | Discharge status: Home / Self Care | Payer: Medicaid Other | Source: Ambulatory Visit | Attending: Obstetrics and Gynecology | Admitting: Obstetrics and Gynecology

## 2017-03-11 DIAGNOSIS — O26891 Other specified pregnancy related conditions, first trimester: Secondary | ICD-10-CM | POA: Insufficient documentation

## 2017-03-11 DIAGNOSIS — R519 Headache, unspecified: Secondary | ICD-10-CM | POA: Diagnosis present

## 2017-03-11 DIAGNOSIS — M542 Cervicalgia: Secondary | ICD-10-CM | POA: Diagnosis present

## 2017-03-11 DIAGNOSIS — F419 Anxiety disorder, unspecified: Secondary | ICD-10-CM | POA: Diagnosis not present

## 2017-03-11 DIAGNOSIS — O99341 Other mental disorders complicating pregnancy, first trimester: Secondary | ICD-10-CM | POA: Diagnosis not present

## 2017-03-11 DIAGNOSIS — M549 Dorsalgia, unspecified: Secondary | ICD-10-CM | POA: Diagnosis present

## 2017-03-11 DIAGNOSIS — O9989 Other specified diseases and conditions complicating pregnancy, childbirth and the puerperium: Secondary | ICD-10-CM

## 2017-03-11 DIAGNOSIS — J45909 Unspecified asthma, uncomplicated: Secondary | ICD-10-CM | POA: Insufficient documentation

## 2017-03-11 DIAGNOSIS — O219 Vomiting of pregnancy, unspecified: Secondary | ICD-10-CM | POA: Diagnosis not present

## 2017-03-11 DIAGNOSIS — D573 Sickle-cell trait: Secondary | ICD-10-CM | POA: Diagnosis not present

## 2017-03-11 DIAGNOSIS — O99891 Other specified diseases and conditions complicating pregnancy: Secondary | ICD-10-CM | POA: Diagnosis present

## 2017-03-11 DIAGNOSIS — Z3A09 9 weeks gestation of pregnancy: Secondary | ICD-10-CM | POA: Diagnosis not present

## 2017-03-11 DIAGNOSIS — R51 Headache: Secondary | ICD-10-CM | POA: Insufficient documentation

## 2017-03-11 DIAGNOSIS — R109 Unspecified abdominal pain: Secondary | ICD-10-CM | POA: Diagnosis present

## 2017-03-11 DIAGNOSIS — O99511 Diseases of the respiratory system complicating pregnancy, first trimester: Secondary | ICD-10-CM | POA: Insufficient documentation

## 2017-03-11 HISTORY — DX: Headache, unspecified: R51.9

## 2017-03-11 HISTORY — DX: Dorsalgia, unspecified: M54.9

## 2017-03-11 HISTORY — DX: Other specified diseases and conditions complicating pregnancy: O99.891

## 2017-03-11 HISTORY — DX: Vomiting of pregnancy, unspecified: O21.9

## 2017-03-11 HISTORY — DX: Cervicalgia: M54.2

## 2017-03-11 HISTORY — DX: Other specified diseases and conditions complicating pregnancy, childbirth and the puerperium: O99.89

## 2017-03-11 HISTORY — DX: Headache: R51

## 2017-03-11 LAB — URINALYSIS, ROUTINE W REFLEX MICROSCOPIC
BILIRUBIN URINE: NEGATIVE
Glucose, UA: NEGATIVE mg/dL
Hgb urine dipstick: NEGATIVE
KETONES UR: NEGATIVE mg/dL
Nitrite: NEGATIVE
PROTEIN: NEGATIVE mg/dL
Specific Gravity, Urine: 1.016 (ref 1.005–1.030)
pH: 6 (ref 5.0–8.0)

## 2017-03-11 MED ORDER — LACTATED RINGERS IV BOLUS (SEPSIS)
1000.0000 mL | Freq: Once | INTRAVENOUS | Status: AC
  Administered 2017-03-11: 1000 mL via INTRAVENOUS

## 2017-03-11 MED ORDER — DIPHENHYDRAMINE HCL 50 MG/ML IJ SOLN
25.0000 mg | INTRAMUSCULAR | Status: AC
  Administered 2017-03-11: 25 mg via INTRAVENOUS
  Filled 2017-03-11: qty 1

## 2017-03-11 MED ORDER — DEXAMETHASONE SODIUM PHOSPHATE 10 MG/ML IJ SOLN
10.0000 mg | INTRAMUSCULAR | Status: AC
  Administered 2017-03-11: 10 mg via INTRAVENOUS
  Filled 2017-03-11: qty 1

## 2017-03-11 MED ORDER — METOCLOPRAMIDE HCL 5 MG/ML IJ SOLN
10.0000 mg | INTRAMUSCULAR | Status: AC
  Administered 2017-03-11: 10 mg via INTRAVENOUS
  Filled 2017-03-11: qty 2

## 2017-03-11 NOTE — Discharge Instructions (Signed)

## 2017-03-11 NOTE — MAU Provider Note (Signed)
History     CSN: [id number]  Arrival date and time: 03/11/17 9622   First Provider Initiated Contact with Patient 03/11/17 1035      Chief Complaint  Patient presents with  . Nausea  . Emesis  . Back Pain  . Abdominal Pain  . Headache  . Neck Pain   Ms. Barbara Haas is a 26 yo G3P1011 at 9.[redacted] wks gestation by LMP presenting with complaints of N/V, H/A, lower back and neck pain, and LLQ cramping. She was seen by Dr. Terri Piedra on Friday 03/07/2017 and was dx'd with BV.  She has not been able to pick up her Rx. She currently takes Diclegis 4 times daily, PNV and Albuterol inhaler prn.  Even with taking Diclegis, she has been unable to keep anything down. She was able to keep her breakfast and dinner down yesterday for about 1-2 hrs. She has not had any appetite today, therefore, she has not eaten anything today.  Emesis   This is a recurrent problem. The current episode started yesterday. The problem occurs 2 to 4 times per day. The problem has been unchanged. The emesis has an appearance of bile and stomach contents. There has been no fever. Associated symptoms include abdominal pain (LLQ cramping) and headaches. She has tried diet change and increased fluids (Take Diclegis 4 times/day) for the symptoms. The treatment provided no relief.  Back Pain  This is a new problem. The current episode started yesterday. The problem occurs intermittently. The problem is unchanged. The pain is present in the sacro-iliac. The quality of the pain is described as aching. The pain does not radiate. The pain is at a severity of 8/10. The pain is moderate. The pain is the same all the time. Associated symptoms include abdominal pain (LLQ cramping), headaches and pelvic pain (LLQ cramping). (Neck pain) Risk factors include pregnancy. She has tried analgesics (Tylenol) for the symptoms. The treatment provided no relief.  Headache   This is a new problem. The current episode started yesterday. The problem occurs  constantly. The problem has been unchanged. The pain is located in the occipital region. The pain radiates to the right neck and left neck. The pain quality is similar to prior headaches. The quality of the pain is described as throbbing. The pain is at a severity of 7/10. The pain is moderate. Associated symptoms include abdominal pain (LLQ cramping), back pain, nausea, neck pain and vomiting. The symptoms are aggravated by bright light. She has tried acetaminophen for the symptoms. The treatment provided no relief.     Past Medical History:  Diagnosis Date  . Anxiety   . Asthma   . Complication of anesthesia    bp drops  . Endometriosis   . Sickle cell trait [facility])     Past Surgical History:  Procedure Laterality Date  . OOPHORECTOMY     left  . OVARIAN CYST REMOVAL    . WISDOM TOOTH EXTRACTION      History reviewed. No pertinent family history.  Social History  Substance Use Topics  . Smoking status: Never Smoker  . Smokeless tobacco: Never Used  . Alcohol use Yes     Comment: not whiile preg    Allergies:  Allergies  Allergen Reactions  . Shellfish Allergy Anaphylaxis  . Codeine Other (See Comments)    Reaction:  Sore throat   . Contrast Media [Iodinated Diagnostic Agents] Nausea And Vomiting  . Other Itching, Swelling and Other (See Comments)    Pt is  allergic to [place].   Reaction:  Lip swelling     Prescriptions Prior to Admission  Medication Sig Dispense Refill Last Dose  . azithromycin (ZITHROMAX) 250 MG tablet Take 1 tablet (250 mg total) by mouth daily. Take first 2 tablets together, then 1 every day until finished. 6 tablet 0   . ibuprofen (ADVIL,MOTRIN) 800 MG tablet Take 800 mg by mouth every 8 (eight) hours as needed for fever, headache, mild pain or moderate pain.   12/04/2016 at 1200  . Levonorgestrel-Ethinyl Estradiol (AMETHIA,CAMRESE) 0.15-0.03 &0.01 MG tablet Take 1 tablet by mouth daily.    12/03/2016 at Unknown time  . ondansetron (ZOFRAN ODT) 8 MG  disintegrating tablet Take 1 tablet (8 mg total) by mouth every 8 (eight) hours as needed for nausea or vomiting. 10 tablet 0 12/03/2016 at Unknown time  . oseltamivir (TAMIFLU) 75 MG capsule Take 1 capsule (75 mg total) by mouth every 12 (twelve) hours. 10 capsule 0     Review of Systems  Constitutional: Positive for appetite change (no appetite today) and fatigue (since being pregnant).  HENT: Negative.   Respiratory: Negative.   Cardiovascular: Negative.   Gastrointestinal: Positive for abdominal pain (LLQ cramping), nausea and vomiting.  Endocrine: Negative.   Genitourinary: Positive for pelvic pain (LLQ cramping).  Musculoskeletal: Positive for back pain and neck pain.  Skin: Negative.   Allergic/Immunologic: Negative.   Neurological: Positive for headaches.  Hematological: Negative.   Psychiatric/Behavioral: Negative.    Physical Exam   Blood pressure 125/74, pulse 92, temperature 98.2 F (36.8 C), temperature source Oral, resp. rate 17, weight 113 kg (249 lb 0.6 oz), last menstrual period 01/03/2017.  Physical Exam  Constitutional: She is oriented to person, place, and time. She appears well-developed and well-nourished.  HENT:  Head: Normocephalic.  Eyes: Pupils are equal, round, and reactive to light.  Neck: Normal range of motion.  Cardiovascular: Normal rate.   Respiratory: Effort normal and breath sounds normal.  GI: Soft. Bowel sounds are normal.  Genitourinary:  Genitourinary Comments: Pelvic deferred - done 5/25; dx'd with BV then  Musculoskeletal: Normal range of motion.  Neurological: She is alert and oriented to person, place, and time. She has normal reflexes.  Skin: Skin is warm and dry.  Psychiatric: She has a normal mood and affect. Her behavior is normal. Judgment and thought content normal.    Results for orders placed or performed during the hospital encounter of 03/11/17 (from the past 24 hour(s))  Urinalysis, Routine w reflex microscopic     Status:  Abnormal   Collection Time: 03/11/17 10:02 AM  Result Value Ref Range   Color, Urine YELLOW YELLOW   APPearance CLOUDY (A) CLEAR   Specific Gravity, Urine 1.016 1.005 - 1.030   pH 6.0 5.0 - 8.0   Glucose, UA NEGATIVE NEGATIVE mg/dL   Hgb urine dipstick NEGATIVE NEGATIVE   Bilirubin Urine NEGATIVE NEGATIVE   Ketones, ur NEGATIVE NEGATIVE mg/dL   Protein, ur NEGATIVE NEGATIVE mg/dL   Nitrite NEGATIVE NEGATIVE   Leukocytes, UA LARGE (A) NEGATIVE   RBC / HPF 0-5 0 - 5 RBC/hpf   WBC, UA 6-30 0 - 5 WBC/hpf   Bacteria, UA RARE (A) NONE SEEN   Squamous Epithelial / LPF TOO NUMEROUS TO COUNT (A) NONE SEEN   Mucous PRESENT    MAU Course  Procedures  MDM CCUA LR 1000 ml bolus Reglan 10 mg slow IVP Decadron 10 mg slow IVP Benadryl 25 mg slow IVP *Consult with Dr. Terri Piedra  re: pt's complaints, lab results, POC - agrees with plan  Assessment and Plan  Nausea and vomiting during pregnancy prior to [redacted] weeks gestation - Continue taking Diclegis as previously prescribed  Back Pain in Pregnancy - Ok to take Tylenol 1000 mg every 6 hrs prn pain - Soak in warm tub for back discomfort  Discharge home Keep scheduled appointment with Dr. Terri Piedra as previously prescribed  Patient verbalized an understanding of the plan of care and agrees.   Laury Deep MSN, CNM 03/11/2017, 10:44 AM

## 2017-03-11 NOTE — MAU Note (Signed)
Pt states she has had nausea and vomiting for weeks, but it became worse yesterday.  She also states she has lower back pain and neck pain. Reports cramping lower left abdomen.

## 2017-03-11 NOTE — MAU Note (Addendum)
+  N/V since yesterday--3 emesis episodes today + headache since yesterday at base of head radiating to neck--rating pain 7/10--tried tylenol with no relief at 8pm last night +abdominal and lower back cramping that has been off and on Denies VAginal bleeding or discharge States she feels hot and cold + decrease in appetite

## 2017-05-24 ENCOUNTER — Inpatient Hospital Stay (HOSPITAL_COMMUNITY)
Admission: AD | Admit: 2017-05-24 | Discharge: 2017-05-24 | Disposition: A | Discharge status: Home / Self Care | Payer: Medicaid Other | Source: Ambulatory Visit | Attending: Obstetrics and Gynecology | Admitting: Obstetrics and Gynecology

## 2017-05-24 ENCOUNTER — Encounter (HOSPITAL_COMMUNITY): Payer: Self-pay | Admitting: *Deleted

## 2017-05-24 DIAGNOSIS — D573 Sickle-cell trait: Secondary | ICD-10-CM | POA: Diagnosis not present

## 2017-05-24 DIAGNOSIS — O23592 Infection of other part of genital tract in pregnancy, second trimester: Secondary | ICD-10-CM | POA: Diagnosis not present

## 2017-05-24 DIAGNOSIS — M549 Dorsalgia, unspecified: Secondary | ICD-10-CM | POA: Insufficient documentation

## 2017-05-24 DIAGNOSIS — L02219 Cutaneous abscess of trunk, unspecified: Secondary | ICD-10-CM

## 2017-05-24 DIAGNOSIS — M542 Cervicalgia: Secondary | ICD-10-CM | POA: Diagnosis not present

## 2017-05-24 DIAGNOSIS — O219 Vomiting of pregnancy, unspecified: Secondary | ICD-10-CM | POA: Insufficient documentation

## 2017-05-24 DIAGNOSIS — O99012 Anemia complicating pregnancy, second trimester: Secondary | ICD-10-CM | POA: Insufficient documentation

## 2017-05-24 DIAGNOSIS — Z3A20 20 weeks gestation of pregnancy: Secondary | ICD-10-CM | POA: Diagnosis not present

## 2017-05-24 DIAGNOSIS — O26892 Other specified pregnancy related conditions, second trimester: Secondary | ICD-10-CM | POA: Diagnosis not present

## 2017-05-24 DIAGNOSIS — L089 Local infection of the skin and subcutaneous tissue, unspecified: Secondary | ICD-10-CM | POA: Diagnosis present

## 2017-05-24 DIAGNOSIS — O9989 Other specified diseases and conditions complicating pregnancy, childbirth and the puerperium: Secondary | ICD-10-CM

## 2017-05-24 MED ORDER — CEPHALEXIN 500 MG PO CAPS
500.0000 mg | ORAL_CAPSULE | Freq: Once | ORAL | Status: AC
  Administered 2017-05-24: 500 mg via ORAL
  Filled 2017-05-24: qty 1

## 2017-05-24 MED ORDER — CEPHALEXIN 500 MG PO CAPS: 500.0000 mg | ORAL_CAPSULE | Freq: Four times a day (QID) | ORAL | 0 refills | Status: DC

## 2017-05-24 NOTE — MAU Provider Note (Signed)
History   G3P1011 @ 20 wks in with c/o abscess on mons pubis. States been going on for months she shaves and it gets worse then it will go down and she will shave again and it will get worse. This time much larger and more painful.  CSN: [id number]  Arrival date & time 05/24/17  1900   None     Chief Complaint  Patient presents with  . Recurrent Skin Infections  . Emesis During Pregnancy    HPI  Past Medical History:  Diagnosis Date  . Anxiety   . Asthma   . Back pain affecting pregnancy 03/11/2017  . Complication of anesthesia    bp drops  . Endometriosis   . Headache 03/11/2017  . Nausea and vomiting during pregnancy prior to [redacted] weeks gestation 03/11/2017  . Neck pain 03/11/2017  . Sickle cell trait [facility])     Past Surgical History:  Procedure Laterality Date  . OOPHORECTOMY     left  . OVARIAN CYST REMOVAL    . WISDOM TOOTH EXTRACTION      History reviewed. No pertinent family history.  Social History  Substance Use Topics  . Smoking status: Never Smoker  . Smokeless tobacco: Never Used  . Alcohol use Yes     Comment: not whiile preg    OB History    Gravida Para Term Preterm AB Living   3 1 1   1 1    SAB TAB Ectopic Multiple Live Births   1     0 1      Review of Systems  Constitutional: Negative.   HENT: Negative.   Eyes: Negative.   Respiratory: Negative.   Cardiovascular: Negative.   Gastrointestinal: Negative.   Endocrine: Negative.   Genitourinary: Negative.        Abscess mons pubis  Musculoskeletal: Negative.   Skin:       Abscess   Allergic/Immunologic: Negative.   Neurological: Negative.   Hematological: Negative.   Psychiatric/Behavioral: Negative.     Allergies  Shellfish allergy; Codeine; Contrast media [iodinated diagnostic agents]; and Other  Home Medications    BP 126/67 (BP Location: Right Arm)   Pulse 67   Temp 98.8 F (37.1 C)   Resp 18   Ht 5' 8.5" (1.74 m)   Wt 258 lb (117 kg)   LMP 01/03/2017   BMI 38.66  kg/m   Physical Exam  Constitutional: She is oriented to person, place, and time. She appears well-developed and well-nourished.  HENT:  Head: Normocephalic.  Eyes: Pupils are equal, round, and reactive to light.  Neck: Normal range of motion.  Cardiovascular: Normal rate, regular rhythm, normal heart sounds and intact distal pulses.   Pulmonary/Chest: Effort normal and breath sounds normal.  Abdominal: Soft. Bowel sounds are normal.  Genitourinary:  Genitourinary Comments: 5-6 cm reddened area mons pubis with 2.5 cm pocketed area in center.  Musculoskeletal: Normal range of motion.  Neurological: She is alert and oriented to person, place, and time. She has normal reflexes.  Skin: Skin is warm and dry.  Psychiatric: She has a normal mood and affect. Her behavior is normal. Judgment and thought content normal.    MAU Course  Procedures (including critical care time)  Labs Reviewed  AEROBIC CULTURE (SUPERFICIAL SPECIMEN)   No results found.   1. Abscess of pubic region   2. Nausea and vomiting during pregnancy prior to [redacted] weeks gestation   3. Back pain affecting pregnancy in second trimester   4.  Neck pain       MDM  5-6 cm reddened area center of mons with firm pocketed area apx 2.5 cm palpated. Area prepped with betadine 2cc 1% lidocaine infiltrated and a #11 knife blade used to I and d  Area. Culture obtained and to lab. apx 3 cc mucopurulent drainage noted. Will start on keflex orally and Warn salt compressed TID. Pt instructed no more shaving that area.  Attempted to contact Dr. Terri Piedra x 2. Pt requesting d/c home. Will d/c home to follow up next week in office.

## 2017-05-24 NOTE — Discharge Instructions (Signed)
Incision and Drainage, Care After  Refer to this sheet in the next few weeks. These instructions provide you with information about caring for yourself after your procedure. Your health care provider may also give you more specific instructions. Your treatment has been planned according to current medical practices, but problems sometimes occur. Call your health care provider if you have any problems or questions after your procedure.  What can I expect after the procedure?  After the procedure, it is common to have:  · Pain or discomfort around your incision site.  · Drainage from your incision.     Follow these instructions at home:  ·   · Take over-the-counter and prescription medicines only as told by your health care provider.  · If you were prescribed an antibiotic medicine, take it as told by your health care provider. Do not stop taking the antibiotic even if you start to feel better.  · Follow instructions from your health care provider about:  ? How to take care of your incision.  ? When and how you should change your packing and bandage (dressing). Wash your hands with soap and water before you change your dressing. If soap and water are not available, use hand sanitizer.  ? When you should remove your dressing.  · Do not take baths, swim, or use a hot tub until your health care provider approves.  · Keep all follow-up visits as told by your health care provider. This is important.  · Check your incision area every day for signs of infection. Check for:  ? More redness, swelling, or pain.  ? More fluid or blood.  ? Warmth.  ? Pus or a bad smell.  Contact a health care provider if:  · Your cyst or abscess returns.  · You have a fever.  · You have more redness, swelling, or pain around your incision.  · You have more fluid or blood coming from your incision.  · Your incision feels warm to the touch.  · You have pus or a bad smell coming from your incision.  Get help right away if:  · You have severe pain or  bleeding.  · You cannot eat or drink without vomiting.  · You have decreased urine output.  · You become short of breath.  · You have chest pain.  · You cough up blood.  · The area where the incision and drainage occurred becomes numb or it tingles.  This information is not intended to replace advice given to you by your health care provider. Make sure you discuss any questions you have with your health care provider.  Document Released: 12/23/2011 Document Revised: 03/01/2016 Document Reviewed: 07/21/2015  Elsevier Interactive Patient Education © 2017 Elsevier Inc.   

## 2017-05-24 NOTE — MAU Note (Signed)
On pubic area noted boil like area off and on for couple months. Area gets larger and smaller. Shaves pubic area. Last wk has been present and getting larger but no area that is able to drain. Vomiting in pregnancy has been present entire time. Drank some juice yesterday that increased nausea but vomiting is about the same as has been. Came in mostly due to "boil".

## 2017-05-29 LAB — AEROBIC CULTURE  (SUPERFICIAL SPECIMEN): GRAM STAIN: NONE SEEN

## 2017-05-29 LAB — AEROBIC CULTURE W GRAM STAIN (SUPERFICIAL SPECIMEN)

## 2017-06-12 ENCOUNTER — Encounter (HOSPITAL_COMMUNITY): Payer: Self-pay | Admitting: *Deleted

## 2017-06-12 ENCOUNTER — Inpatient Hospital Stay (HOSPITAL_COMMUNITY)
Admission: AD | Admit: 2017-06-12 | Discharge: 2017-06-12 | Disposition: A | Discharge status: Home / Self Care | Payer: Medicaid Other | Source: Ambulatory Visit | Attending: Obstetrics and Gynecology | Admitting: Obstetrics and Gynecology

## 2017-06-12 DIAGNOSIS — Z9889 Other specified postprocedural states: Secondary | ICD-10-CM | POA: Insufficient documentation

## 2017-06-12 DIAGNOSIS — Z91041 Radiographic dye allergy status: Secondary | ICD-10-CM | POA: Insufficient documentation

## 2017-06-12 DIAGNOSIS — Z91013 Allergy to seafood: Secondary | ICD-10-CM | POA: Insufficient documentation

## 2017-06-12 DIAGNOSIS — Z885 Allergy status to narcotic agent status: Secondary | ICD-10-CM | POA: Insufficient documentation

## 2017-06-12 DIAGNOSIS — O99891 Other specified diseases and conditions complicating pregnancy: Secondary | ICD-10-CM

## 2017-06-12 DIAGNOSIS — Z90721 Acquired absence of ovaries, unilateral: Secondary | ICD-10-CM | POA: Diagnosis not present

## 2017-06-12 DIAGNOSIS — M549 Dorsalgia, unspecified: Secondary | ICD-10-CM

## 2017-06-12 DIAGNOSIS — K047 Periapical abscess without sinus: Secondary | ICD-10-CM | POA: Insufficient documentation

## 2017-06-12 DIAGNOSIS — O26892 Other specified pregnancy related conditions, second trimester: Secondary | ICD-10-CM

## 2017-06-12 DIAGNOSIS — M542 Cervicalgia: Secondary | ICD-10-CM

## 2017-06-12 DIAGNOSIS — O219 Vomiting of pregnancy, unspecified: Secondary | ICD-10-CM

## 2017-06-12 DIAGNOSIS — O9989 Other specified diseases and conditions complicating pregnancy, childbirth and the puerperium: Secondary | ICD-10-CM | POA: Insufficient documentation

## 2017-06-12 DIAGNOSIS — Z3A22 22 weeks gestation of pregnancy: Secondary | ICD-10-CM | POA: Diagnosis not present

## 2017-06-12 DIAGNOSIS — Z91018 Allergy to other foods: Secondary | ICD-10-CM | POA: Insufficient documentation

## 2017-06-12 MED ORDER — HYDROCODONE-ACETAMINOPHEN 5-325 MG PO TABS: 1.0000 | ORAL_TABLET | ORAL | 0 refills | Status: DC | PRN

## 2017-06-12 MED ORDER — AMOXICILLIN-POT CLAVULANATE 875-125 MG PO TABS: 1.0000 | ORAL_TABLET | Freq: Two times a day (BID) | ORAL | 0 refills | Status: DC

## 2017-06-12 NOTE — Discharge Instructions (Signed)
Dental Abscess A dental abscess is pus in or around a tooth. Follow these instructions at home:  Take medicines only as told by your dentist.  If you were prescribed antibiotic medicine, finish all of it even if you start to feel better.  Rinse your mouth (gargle) often with salt water.  Do not drive or use heavy machinery, like a lawn mower, while taking pain medicine.  Do not apply heat to the outside of your mouth.  Keep all follow-up visits as told by your dentist. This is important. Contact a doctor if:  Your pain is worse, and medicine does not help. Get help right away if:  You have a fever or chills.  Your symptoms suddenly get worse.  You have a very bad headache.  You have problems breathing or swallowing.  You have trouble opening your mouth.  You have puffiness (swelling) in your neck or around your eye. This information is not intended to replace advice given to you by your health care provider. Make sure you discuss any questions you have with your health care provider. Document Released: 02/14/2015 Document Revised: 03/07/2016 Document Reviewed: 09/27/2014 Elsevier Interactive Patient Education  2018 Elsevier Inc.  

## 2017-06-12 NOTE — MAU Provider Note (Signed)
History     CSN: [id number]  Arrival date and time: 06/12/17 2159   First Provider Initiated Contact with Patient 06/12/17 2224      Chief Complaint  Patient presents with  . Dental Pain   Barbara Haas is a 26 y.o. G3P1011 at [redacted]w[redacted]d who presents today with a toothache. She states that she has had this for about one week. She had a filling placed in the area about one year ago. She has an appointment with her dentist on 06/24/17. She denies any VB or LOF. She has had some fetal movement. Next OB appointment 06/24/17.    Dental Pain   This is a new problem. The current episode started in the past 7 days. The problem occurs constantly. The problem has been unchanged. The pain is at a severity of 7/10. Pertinent negatives include no fever. She has tried acetaminophen and NSAIDs for the symptoms. The treatment provided no relief.     Past Medical History:  Diagnosis Date  . Anxiety   . Asthma   . Back pain affecting pregnancy 03/11/2017  . Complication of anesthesia    bp drops  . Endometriosis   . Headache 03/11/2017  . Nausea and vomiting during pregnancy prior to [redacted] weeks gestation 03/11/2017  . Neck pain 03/11/2017  . Sickle cell trait [facility])     Past Surgical History:  Procedure Laterality Date  . OOPHORECTOMY     left  . OVARIAN CYST REMOVAL    . WISDOM TOOTH EXTRACTION      No family history on file.  Social History  Substance Use Topics  . Smoking status: Never Smoker  . Smokeless tobacco: Never Used  . Alcohol use Yes     Comment: not whiile preg    Allergies:  Allergies  Allergen Reactions  . Shellfish Allergy Anaphylaxis  . Codeine Other (See Comments)    Reaction:  Sore throat   . Contrast Media [Iodinated Diagnostic Agents] Nausea And Vomiting  . Other Itching, Swelling and Other (See Comments)    Pt is allergic to [place].   Reaction:  Lip swelling     Prescriptions Prior to Admission  Medication Sig Dispense Refill Last Dose  . cephALEXin  (KEFLEX) 500 MG capsule Take 1 capsule (500 mg total) by mouth 4 (four) times daily. 40 capsule 0   . Prenatal Vit-Fe Fumarate-FA (MULTIVITAMIN-PRENATAL) 27-0.8 MG TABS tablet Take 1 tablet by mouth daily at 12 noon.   05/23/2017 at Unknown time    Review of Systems  Constitutional: Negative for chills and fever.  Gastrointestinal: Positive for nausea and vomiting (has been an ongoing pregnancy issue. ).  Genitourinary: Negative for dysuria, frequency, vaginal bleeding and vaginal discharge.   Physical Exam   Blood pressure 112/66, pulse 81, temperature 98.8 F (37.1 C), temperature source Oral, resp. rate 20, height 5' 9.5" (1.765 m), weight 264 lb 4 oz (119.9 kg), last menstrual period 01/03/2017, unknown if currently breastfeeding.  Physical Exam  Constitutional: She is oriented to person, place, and time. She appears well-developed and well-nourished. No distress.  HENT:  Head: Normocephalic.  Mouth/Throat: Dental abscesses and dental caries present.    Cardiovascular: Normal rate.   Respiratory: Effort normal.  GI: Soft. There is no tenderness. There is no rebound.  Genitourinary:  Genitourinary Comments: VOH 607 with doppler   Neurological: She is alert and oriented to person, place, and time.  Skin: Skin is warm and dry.  Psychiatric: She has a normal mood and affect.  MAU Course  Procedures  MDM   Assessment and Plan   Dental Abscess [redacted] week gestation  DC home Comfort measures reviewed  2nd/3rd Trimester precautions  Fetal kick counts RX: vicodin PRN #10, Augmenting 875 BID x 7 days  Return to MAU as needed FU with OB as planned Call dentist for further plan, and if they would like to see you sooner.   Follow-up Information    Bovard-Stuckert, Jody, MD Follow up.   Specialty:  Obstetrics and Gynecology Contact information: [address] [place] [address] [phone]           Marcille Buffy 06/12/2017, 10:25 PM

## 2017-06-12 NOTE — MAU Note (Signed)
PT  SAYS UPPER AND LOWER - RIGHT SIDE - BACK TOOTH-   STARTED HURTING  LAST WEEK-  GOTTEN. .     WENT TO   DR DAVIS IN [place]  FOR A FILLING  LAST YEAR-  BUT NOW  HURTS WITH HOT/ COLD.    HAS AN APPOINTMENT ON 9-11.

## 2017-10-09 ENCOUNTER — Encounter (HOSPITAL_COMMUNITY): Payer: Self-pay | Admitting: Obstetrics and Gynecology

## 2017-10-09 ENCOUNTER — Inpatient Hospital Stay (HOSPITAL_COMMUNITY)
Admission: AD | Admit: 2017-10-09 | Discharge: 2017-10-09 | Disposition: A | Discharge status: Home / Self Care | Payer: Medicaid Other | Source: Ambulatory Visit | Attending: Obstetrics and Gynecology | Admitting: Obstetrics and Gynecology

## 2017-10-09 ENCOUNTER — Inpatient Hospital Stay (HOSPITAL_COMMUNITY)
Admission: AD | Admit: 2017-10-09 | Discharge: 2017-10-11 | DRG: 807 | Disposition: A | Discharge status: Home / Self Care | Payer: Medicaid Other | Source: Ambulatory Visit | Attending: Obstetrics and Gynecology | Admitting: Obstetrics and Gynecology

## 2017-10-09 ENCOUNTER — Encounter (HOSPITAL_COMMUNITY): Payer: Self-pay

## 2017-10-09 DIAGNOSIS — Z3483 Encounter for supervision of other normal pregnancy, third trimester: Secondary | ICD-10-CM | POA: Diagnosis present

## 2017-10-09 DIAGNOSIS — D573 Sickle-cell trait: Secondary | ICD-10-CM | POA: Diagnosis present

## 2017-10-09 DIAGNOSIS — O479 False labor, unspecified: Secondary | ICD-10-CM

## 2017-10-09 DIAGNOSIS — O9902 Anemia complicating childbirth: Principal | ICD-10-CM | POA: Diagnosis present

## 2017-10-09 DIAGNOSIS — Z3A39 39 weeks gestation of pregnancy: Secondary | ICD-10-CM | POA: Diagnosis not present

## 2017-10-09 LAB — CBC
HCT: 34 % — ABNORMAL LOW (ref 36.0–46.0)
Hemoglobin: 11.6 g/dL — ABNORMAL LOW (ref 12.0–15.0)
MCH: 29.5 pg (ref 26.0–34.0)
MCHC: 34.1 g/dL (ref 30.0–36.0)
MCV: 86.5 fL (ref 78.0–100.0)
PLATELETS: 175 10*3/uL (ref 150–400)
RBC: 3.93 MIL/uL (ref 3.87–5.11)
RDW: 15 % (ref 11.5–15.5)
WBC: 9.1 10*3/uL (ref 4.0–10.5)

## 2017-10-09 LAB — TYPE AND SCREEN
ABO/RH(D): O POS
Antibody Screen: NEGATIVE

## 2017-10-09 MED ORDER — PHENYLEPHRINE 40 MCG/ML (10ML) SYRINGE FOR IV PUSH (FOR BLOOD PRESSURE SUPPORT)
80.0000 ug | PREFILLED_SYRINGE | INTRAVENOUS | Status: DC | PRN
  Filled 2017-10-09: qty 5

## 2017-10-09 MED ORDER — BENZOCAINE-MENTHOL 20-0.5 % EX AERO: 1.0000 "application " | INHALATION_SPRAY | CUTANEOUS | Status: DC | PRN

## 2017-10-09 MED ORDER — SENNOSIDES-DOCUSATE SODIUM 8.6-50 MG PO TABS
2.0000 | ORAL_TABLET | ORAL | Status: DC
  Administered 2017-10-09 – 2017-10-10 (×2): 2 via ORAL
  Filled 2017-10-09 (×2): qty 2

## 2017-10-09 MED ORDER — LACTATED RINGERS IV SOLN: 500.0000 mL | INTRAVENOUS | Status: DC | PRN

## 2017-10-09 MED ORDER — DIPHENHYDRAMINE HCL 25 MG PO CAPS: 25.0000 mg | ORAL_CAPSULE | Freq: Four times a day (QID) | ORAL | Status: DC | PRN

## 2017-10-09 MED ORDER — SOD CITRATE-CITRIC ACID 500-334 MG/5ML PO SOLN: 30.0000 mL | ORAL | Status: DC | PRN

## 2017-10-09 MED ORDER — ACETAMINOPHEN 325 MG PO TABS
650.0000 mg | ORAL_TABLET | ORAL | Status: DC | PRN
  Administered 2017-10-09 – 2017-10-11 (×2): 650 mg via ORAL
  Filled 2017-10-09 (×2): qty 2

## 2017-10-09 MED ORDER — EPHEDRINE 5 MG/ML INJ
10.0000 mg | INTRAVENOUS | Status: DC | PRN
  Filled 2017-10-09: qty 2

## 2017-10-09 MED ORDER — OXYCODONE-ACETAMINOPHEN 5-325 MG PO TABS: 2.0000 | ORAL_TABLET | ORAL | Status: DC | PRN

## 2017-10-09 MED ORDER — LACTATED RINGERS IV SOLN: INTRAVENOUS | Status: DC

## 2017-10-09 MED ORDER — SIMETHICONE 80 MG PO CHEW: 80.0000 mg | CHEWABLE_TABLET | ORAL | Status: DC | PRN

## 2017-10-09 MED ORDER — OXYCODONE-ACETAMINOPHEN 5-325 MG PO TABS: 1.0000 | ORAL_TABLET | ORAL | Status: DC | PRN

## 2017-10-09 MED ORDER — PRENATAL MULTIVITAMIN CH
1.0000 | ORAL_TABLET | Freq: Every day | ORAL | Status: DC
  Administered 2017-10-10: 1 via ORAL
  Filled 2017-10-09: qty 1

## 2017-10-09 MED ORDER — BUTORPHANOL TARTRATE 1 MG/ML IJ SOLN
1.0000 mg | Freq: Once | INTRAMUSCULAR | Status: AC
  Administered 2017-10-09: 1 mg via INTRAVENOUS
  Filled 2017-10-09 (×2): qty 1

## 2017-10-09 MED ORDER — LIDOCAINE HCL (PF) 1 % IJ SOLN
30.0000 mL | INTRAMUSCULAR | Status: DC | PRN
  Filled 2017-10-09: qty 30

## 2017-10-09 MED ORDER — OXYCODONE HCL 5 MG PO TABS: 10.0000 mg | ORAL_TABLET | ORAL | Status: DC | PRN

## 2017-10-09 MED ORDER — VITAMIN K1 1 MG/0.5ML IJ SOLN
INTRAMUSCULAR | Status: AC
  Filled 2017-10-09: qty 0.5

## 2017-10-09 MED ORDER — ZOLPIDEM TARTRATE 5 MG PO TABS
5.0000 mg | ORAL_TABLET | Freq: Every evening | ORAL | Status: DC | PRN
Start: 2017-10-09 — End: 2017-10-11

## 2017-10-09 MED ORDER — DIPHENHYDRAMINE HCL 50 MG/ML IJ SOLN: 12.5000 mg | INTRAMUSCULAR | Status: DC | PRN

## 2017-10-09 MED ORDER — OXYCODONE HCL 5 MG PO TABS: 5.0000 mg | ORAL_TABLET | ORAL | Status: DC | PRN

## 2017-10-09 MED ORDER — ONDANSETRON HCL 4 MG/2ML IJ SOLN: 4.0000 mg | INTRAMUSCULAR | Status: DC | PRN

## 2017-10-09 MED ORDER — COCONUT OIL OIL
1.0000 "application " | TOPICAL_OIL | Status: DC | PRN
  Administered 2017-10-10: 1 via TOPICAL
  Filled 2017-10-09: qty 120

## 2017-10-09 MED ORDER — OXYTOCIN 10 UNIT/ML IJ SOLN
10.0000 [IU] | Freq: Once | INTRAMUSCULAR | Status: DC | PRN
  Filled 2017-10-09: qty 1

## 2017-10-09 MED ORDER — OXYTOCIN 40 UNITS IN LACTATED RINGERS INFUSION - SIMPLE MED
2.5000 [IU]/h | INTRAVENOUS | Status: DC
  Filled 2017-10-09: qty 1000

## 2017-10-09 MED ORDER — IBUPROFEN 600 MG PO TABS
600.0000 mg | ORAL_TABLET | Freq: Four times a day (QID) | ORAL | Status: DC
  Administered 2017-10-09 – 2017-10-11 (×7): 600 mg via ORAL
  Filled 2017-10-09 (×7): qty 1

## 2017-10-09 MED ORDER — ONDANSETRON HCL 4 MG PO TABS: 4.0000 mg | ORAL_TABLET | ORAL | Status: DC | PRN

## 2017-10-09 MED ORDER — FENTANYL 2.5 MCG/ML BUPIVACAINE 1/10 % EPIDURAL INFUSION (WH - ANES): 14.0000 mL/h | INTRAMUSCULAR | Status: DC | PRN

## 2017-10-09 MED ORDER — LACTATED RINGERS IV SOLN: 500.0000 mL | Freq: Once | INTRAVENOUS | Status: DC

## 2017-10-09 MED ORDER — ACETAMINOPHEN 325 MG PO TABS: 650.0000 mg | ORAL_TABLET | ORAL | Status: DC | PRN

## 2017-10-09 MED ORDER — OXYTOCIN BOLUS FROM INFUSION: 500.0000 mL | Freq: Once | INTRAVENOUS | Status: DC

## 2017-10-09 MED ORDER — DIBUCAINE 1 % RE OINT: 1.0000 "application " | TOPICAL_OINTMENT | RECTAL | Status: DC | PRN

## 2017-10-09 MED ORDER — WITCH HAZEL-GLYCERIN EX PADS: 1.0000 "application " | MEDICATED_PAD | CUTANEOUS | Status: DC | PRN

## 2017-10-09 MED ORDER — TETANUS-DIPHTH-ACELL PERTUSSIS 5-2.5-18.5 LF-MCG/0.5 IM SUSP: 0.5000 mL | Freq: Once | INTRAMUSCULAR | Status: DC

## 2017-10-09 NOTE — Lactation Note (Signed)
This note was copied from a baby's chart. Lactation Consultation Note  Patient Name: Barbara Haas [id number] Date: 10/09/2017 Reason for consult: Initial assessment   Mother called for assistance w/ latching. Nipples evert and compressible. Provided education how breast milk comes to volume. Reviewed hand expression.  Drops expressed. Helped mother latch baby in cross cradle hold on L breast. Reviewed breast compression.  Encouraged bf on both breasts per session. Intermittent sucks and swallows observed.   Maternal Data Has patient been taught Hand Expression?: Yes Does the patient have breastfeeding experience prior to this delivery?: Yes  Feeding    LATCH Score                   Interventions    Lactation Tools Discussed/Used     Consult Status Consult Status: Follow-up Date: 10/10/17 Follow-up type: In-patient    [name] [facility] 10/09/2017, 6:01 PM

## 2017-10-09 NOTE — Lactation Note (Signed)
This note was copied from a baby's chart. Lactation Consultation Note  Patient Name: Barbara Haas [id number] Date: 10/09/2017 Reason for consult: Initial assessment  Room full of visitors. P2, Baby 3 hours old.  Mother states she breastfed her first child until 3.5 months when her nipples became very sore she stopped. She states she knows how to hand express and has viewed drops. Baby sleeping in mother's arms. Mother is breastfeeding and formula feeding and states baby will not wake to "take him bottle". Encouraged mother to breastfeed before offering formula to help establish her milk supply. Mom encouraged to feed baby 8-12 times/24 hours and with feeding cues.  Suggest mother call if she would like assistance. Mom made aware of O/P services, breastfeeding support groups, community resources, and our phone # for post-discharge questions.     Maternal Data Has patient been taught Hand Expression?: Yes Does the patient have breastfeeding experience prior to this delivery?: Yes  Feeding Feeding Type: Formula Nipple Type: Slow - flow  LATCH Score                   Interventions    Lactation Tools Discussed/Used     Consult Status Consult Status: Follow-up Date: 10/10/17 Follow-up type: In-patient    Barbara Haas [facility] 10/09/2017, 3:46 PM

## 2017-10-09 NOTE — MAU Note (Signed)
Pt reports contractions that started at 9pm that are now every 5-10. Reports some vaginal bleeding. Reports good fetal movement. Cervix was 3.5cm last week.

## 2017-10-09 NOTE — H&P (Signed)
Barbara Haas is a 26 y.o. female presenting for advanced labor. Pt 6cm on arrival and quickly progressed to complete. GBS neg. Prenatal care was benign.  . OB History    Gravida Para Term Preterm AB Living   3 1 1   1 1    SAB TAB Ectopic Multiple Live Births   1     0 1     Past Medical History:  Diagnosis Date  . Anxiety   . Asthma   . Back pain affecting pregnancy 03/11/2017  . Complication of anesthesia    bp drops  . Endometriosis   . Headache 03/11/2017  . Nausea and vomiting during pregnancy prior to [redacted] weeks gestation 03/11/2017  . Neck pain 03/11/2017  . Sickle cell trait [facility])    Past Surgical History:  Procedure Laterality Date  . OOPHORECTOMY     left  . OVARIAN CYST REMOVAL    . WISDOM TOOTH EXTRACTION     Family History: family history is not on file. Social History:  reports that  has never smoked. she has never used smokeless tobacco. She reports that she drinks alcohol. She reports that she does not use drugs.     Maternal Diabetes: No Genetic Screening: Normal Maternal Ultrasounds/Referrals: Normal Fetal Ultrasounds or other Referrals:  None Maternal Substance Abuse:  No Significant Maternal Medications:  None Significant Maternal Lab Results:  Lab values include: Group B Strep negative Other Comments:  None  Review of Systems  Constitutional: Positive for malaise/fatigue. Negative for chills, fever and weight loss.  Eyes: Negative for blurred vision.  Respiratory: Positive for shortness of breath.   Cardiovascular: Negative for chest pain.  Gastrointestinal: Positive for abdominal pain.  Musculoskeletal: Positive for back pain.  Skin: Negative for itching and rash.  Neurological: Negative for headaches.  Psychiatric/Behavioral: The patient is nervous/anxious.    Maternal Medical History:  Reason for admission: Contractions.   Contractions: Onset was 6-12 hours ago.   Frequency: regular.   Perceived severity is strong.    Fetal activity:  Perceived fetal activity is normal.   Last perceived fetal movement was within the past hour.    Prenatal complications: no prenatal complications Prenatal Complications - Diabetes: none.    Dilation: 6 Effacement (%): 90 Station: 0 Exam by:: Sunday Corn, CNM Blood pressure 139/81, pulse 90, temperature 98 F (36.7 C), temperature source Oral, resp. rate 20, last menstrual period 01/03/2017, unknown if currently breastfeeding. Maternal Exam:  Uterine Assessment: Contraction strength is firm.  Contraction frequency is regular.   Abdomen: Patient reports generalized tenderness.  Estimated fetal weight is AGA.   Fetal presentation: vertex  Introitus: Normal vulva. Normal vagina.  Pelvis: adequate for delivery.   Cervix: Cervix evaluated by digital exam.     Physical Exam  Constitutional: She is oriented to person, place, and time. She appears well-developed and well-nourished.  Neck: Normal range of motion.  Respiratory: Effort normal.  GI: There is generalized tenderness.  Genitourinary: Vagina normal and uterus normal.  Musculoskeletal: Normal range of motion.  Neurological: She is alert and oriented to person, place, and time.  Psychiatric: She has a normal mood and affect. Her behavior is normal. Judgment and thought content normal.    Prenatal labs: ABO, Rh:   Antibody:   Rubella:   RPR:    HBsAg:    HIV:    GBS:     Assessment/Plan: G3P1011 at 58 6/7wks in active labor SVD imminent   Venetia Night Banga 10/09/2017, 12:30 PM

## 2017-10-09 NOTE — Discharge Instructions (Signed)
Braxton Hicks Contractions °Contractions of the uterus can occur throughout pregnancy, but they are not always a sign that you are in labor. You may have practice contractions called Braxton Hicks contractions. These false labor contractions are sometimes confused with true labor. °What are Braxton Hicks contractions? °Braxton Hicks contractions are tightening movements that occur in the muscles of the uterus before labor. Unlike true labor contractions, these contractions do not result in opening (dilation) and thinning of the cervix. Toward the end of pregnancy (32-34 weeks), Braxton Hicks contractions can happen more often and may become stronger. These contractions are sometimes difficult to tell apart from true labor because they can be very uncomfortable. You should not feel embarrassed if you go to the hospital with false labor. °Sometimes, the only way to tell if you are in true labor is for your health care provider to look for changes in the cervix. The health care provider will do a physical exam and may monitor your contractions. If you are not in true labor, the exam should show that your cervix is not dilating and your water has not broken. °If there are other health problems associated with your pregnancy, it is completely safe for you to be sent home with false labor. You may continue to have Braxton Hicks contractions until you go into true labor. °How to tell the difference between true labor and false labor °True labor °· Contractions last 30-70 seconds. °· Contractions become very regular. °· Discomfort is usually felt in the top of the uterus, and it spreads to the lower abdomen and low back. °· Contractions do not go away with walking. °· Contractions usually become more intense and increase in frequency. °· The cervix dilates and gets thinner. °False labor °· Contractions are usually shorter and not as strong as true labor contractions. °· Contractions are usually irregular. °· Contractions  are often felt in the front of the lower abdomen and in the groin. °· Contractions may go away when you walk around or change positions while lying down. °· Contractions get weaker and are shorter-lasting as time goes on. °· The cervix usually does not dilate or become thin. °Follow these instructions at home: °· Take over-the-counter and prescription medicines only as told by your health care provider. °· Keep up with your usual exercises and follow other instructions from your health care provider. °· Eat and drink lightly if you think you are going into labor. °· If Braxton Hicks contractions are making you uncomfortable: °? Change your position from lying down or resting to walking, or change from walking to resting. °? Sit and rest in a tub of warm water. °? Drink enough fluid to keep your urine pale yellow. Dehydration may cause these contractions. °? Do slow and deep breathing several times an hour. °· Keep all follow-up prenatal visits as told by your health care provider. This is important. °Contact a health care provider if: °· You have a fever. °· You have continuous pain in your abdomen. °Get help right away if: °· Your contractions become stronger, more regular, and closer together. °· You have fluid leaking or gushing from your vagina. °· You pass blood-tinged mucus (bloody show). °· You have bleeding from your vagina. °· You have low back pain that you never had before. °· You feel your baby’s head pushing down and causing pelvic pressure. °· Your baby is not moving inside you as much as it used to. °Summary °· Contractions that occur before labor are called Braxton   Hicks contractions, false labor, or practice contractions. °· Braxton Hicks contractions are usually shorter, weaker, farther apart, and less regular than true labor contractions. True labor contractions usually become progressively stronger and regular and they become more frequent. °· Manage discomfort from Braxton Hicks contractions by  changing position, resting in a warm bath, drinking plenty of water, or practicing deep breathing. °This information is not intended to replace advice given to you by your health care provider. Make sure you discuss any questions you have with your health care provider. °Document Released: 02/13/2017 Document Revised: 02/13/2017 Document Reviewed: 02/13/2017 °Elsevier Interactive Patient Education © 2018 Elsevier Inc. ° °

## 2017-10-10 LAB — CBC
HCT: 32.5 % — ABNORMAL LOW (ref 36.0–46.0)
Hemoglobin: 11 g/dL — ABNORMAL LOW (ref 12.0–15.0)
MCH: 28.7 pg (ref 26.0–34.0)
MCHC: 33.8 g/dL (ref 30.0–36.0)
MCV: 84.9 fL (ref 78.0–100.0)
PLATELETS: 197 10*3/uL (ref 150–400)
RBC: 3.83 MIL/uL — AB (ref 3.87–5.11)
RDW: 15.1 % (ref 11.5–15.5)
WBC: 12.7 10*3/uL — ABNORMAL HIGH (ref 4.0–10.5)

## 2017-10-10 LAB — RPR: RPR: NONREACTIVE

## 2017-10-10 NOTE — Lactation Note (Signed)
This note was copied from a baby's chart. Lactation Consultation Note  Patient Name: Barbara Haas [id number] Date: 10/10/2017 Reason for consult: Follow-up assessment;Nipple pain/trauma   Follow up with mom of 39 hour old infant. Infant with 15 BF for 10-30 minutes, 2 voids and 5 stools in the last 24 hours. LATCH scores 9. Infant weight 7 lb 9 oz with 4% weight loss since birth.   Infant was latched and feeding. Mom noted that her nipples were hurting "some". Infant shallowly latched, assisted mom with relatching in a deeper latch with good head support. Mom report is did help with the pain. Infant with intermittent swallows. Enc mom to stimulate infant as needed with feeding and to massage/compress breast with feedings to stimulate suckle and milk transfer. Reviewed awakening techniques with mom.    Mom asked for something for her nipples due to tenderness. She reports sometimes it is tender with latch and sometimes throughout feeding. Gave her Coconut oil with instructions for use after EBM applied. When returned with coconut oil, mom asked for comfort gels. Given with instructions for use. Enc mom to relatch if pain is lasting throughout feeding. Mom with soft compressible breasts and areola everted nipples.   Infant still feeding when Williams left room. Mom to call out for feeding assistance as needed.    Maternal Data Has patient been taught Hand Expression?: Yes Does the patient have breastfeeding experience prior to this delivery?: Yes  Feeding Feeding Type: Breast Fed Length of feed: 25 min  LATCH Score Latch: Grasps breast easily, tongue down, lips flanged, rhythmical sucking.  Audible Swallowing: A few with stimulation  Type of Nipple: Everted at rest and after stimulation  Comfort (Breast/Nipple): Filling, red/small blisters or bruises, mild/mod discomfort  Hold (Positioning): Assistance needed to correctly position infant at breast and maintain latch.  LATCH Score:  7  Interventions Interventions: Breast feeding basics reviewed;Adjust position;Assisted with latch;Support pillows;Skin to skin;Position options;Comfort gels;Coconut oil;Expressed milk;Breast compression  Lactation Tools Discussed/Used     Consult Status Consult Status: Follow-up Date: 10/11/17 Follow-up type: In-patient    Debby Freiberg Hice 10/10/2017, 5:11 PM

## 2017-10-10 NOTE — Progress Notes (Signed)
Post Partum Day 1 Subjective: no complaints, up ad lib and tolerating PO.  Working on breastfeeding  Objective: Blood pressure (!) 149/83, pulse 61, temperature 98.4 F (36.9 C), temperature source Oral, resp. rate 18, last menstrual period 01/03/2017, unknown if currently breastfeeding.  Physical Exam:  General: alert and cooperative Lochia: appropriate Uterine Fundus: firm   Recent Labs    10/09/17 1145 10/10/17 0600  HGB 11.6* 11.0*  HCT 34.0* 32.5*    Assessment/Plan: Plan for discharge tomorrow  Plans circ with pediatrician  LOS: 1 day   Logan Bores 10/10/2017, 8:44 AM

## 2017-10-10 NOTE — Plan of Care (Signed)
Progressing appropriately. To bathroom without assistance. Encouraged to call to assistance with breastfeeding and LATCH assessment.

## 2017-10-11 MED ORDER — IBUPROFEN 600 MG PO TABS: 600.0000 mg | ORAL_TABLET | Freq: Four times a day (QID) | ORAL | 0 refills | Status: DC

## 2017-10-11 NOTE — Clinical Social Work Maternal (Signed)
CLINICAL SOCIAL WORK MATERNAL/CHILD NOTE  Patient Details  Name: Barbara Haas MRN: [id number] Date of Birth: 01/19/1991  Date:  10/11/2017  Clinical Social Worker Initiating Note:  Zayed Griffie LCSW       Date/Time: Initiated:  10/11/17/         Child's Name:  [place] baby boy    Biological Parents:  Mother, Father   Need for Interpreter:  None   Reason for Referral:  Other (Comment)(history of post partem depression)   Address:  [address] [place] [address]    Phone number:  [phone] (home)     Additional phone number:   Household Members/Support Persons (HM/SP):   Household Member/Support Person 1   HM/SP Name Relationship DOB or Age  HM/SP -1 Aminu,Yusef significant other   HM/SP -2     HM/SP -3     HM/SP -4     HM/SP -5     HM/SP -6     HM/SP -7     HM/SP -8       Natural Supports (not living in the home): Immediate Family, Extended Family   Professional Supports:None   Employment:Unemployed   Type of Work:     Education:  High school graduate   Homebound arranged:    Financial Resources:Medicaid   Other Resources: Food Stamps , WIC   Cultural/Religious Considerations Which May Impact Care: none  Strengths: Home prepared for child    Psychotropic Medications:         Pediatrician:       Pediatrician List:   [place]   [place]   [place]   [place]   [place]   [place]     Pediatrician Fax Number:    Risk Factors/Current Problems: Other (Comment)(history of post partem)   Cognitive State: Able to Concentrate , Alert    Mood/Affect: Calm , Comfortable , Happy    CSW Assessment:CSW received consult due to score 12 on Edinburgh Depression Screen. LCSW met with MOB, newborn and FOB at bedside to assess for services.  MOB provided verbal permission for FOB to remain in the room and hear information discussed.  MOB reported that she had post  partem with her first baby two years ago and was aware of the symptoms.  MOB reported that her doctor sent her to a therapist who helped her with her post partem symptoms.  MOB reported that she did not need any medication and stated that the post partem did not last very long.  MOB reported that she had the support of the FOB and both of their mothers who helped care for her two year old while she was at the hospital.  MOB reported that she was not working at this time and that FOB had the next week off to help with taking care of newborn.  LCSW provided education regarding Baby Blues vs PMADs.LCSW encouraged MOB to evaluate her mental health throughout the postpartum period with the use of the New Mom Checklist developed by [facility] and notify a medical professional if symptoms arise. LCSW also provided handout of support groups that are offered at the hospital that MOB may attend for additional support.   CSW Plan/Description: No Further Intervention Required/No Barriers to Discharge    Cherish Runde G Uri Turnbough, LCSW 10/11/2017, 12:46 PM       10/11/2017, 12:46 PM

## 2017-10-11 NOTE — Discharge Summary (Signed)
OB Discharge Summary     Patient Name: Barbara Haas DOB: June 12, 1991 MRN: [id number]  Date of admission: 10/09/2017 Delivering MD: Lavonia Drafts   Date of discharge: 10/11/2017  Admitting diagnosis: 40wks, contractions Intrauterine pregnancy: [redacted]w[redacted]d     Secondary diagnosis:  Active Problems:   Postpartum care following vaginal delivery   Indication for care in labor or delivery   Spontaneous vaginal delivery  Additional problems: none     Discharge diagnosis: Term Pregnancy Delivered                                                                                                Post partum procedures:none  Augmentation: AROM  Complications: None  Hospital course:  Onset of Labor With Vaginal Delivery     26 y.o. yo [name] at [redacted]w[redacted]d was admitted in Active Labor on 10/09/2017. Patient had an uncomplicated labor course as follows:  Membrane Rupture Time/Date: 12:03 PM ,10/09/2017   Intrapartum Procedures: Episiotomy: None [1]                                         Lacerations:  Periurethral [8]  Patient had a delivery of a Viable infant. 10/09/2017  Information for the patient's newborn:  Barbara, Haas [[id number]]  Delivery Method: Vaginal, Spontaneous(Filed from Delivery Summary)    Pateint had an uncomplicated postpartum course.  She is ambulating, tolerating a regular diet, passing flatus, and urinating well. Patient is discharged home in stable condition on 10/11/17.   Physical exam  Vitals:   10/10/17 0550 10/10/17 0830 10/10/17 1946 10/11/17 0558  BP: (!) 149/83  135/81 120/73  Pulse: 61  63 68  Resp:   18 18  Temp: 98.4 F (36.9 C) 98.4 F (36.9 C) 98.9 F (37.2 C) 98.2 F (36.8 C)  TempSrc: Oral Oral Oral Oral   General: alert and cooperative Lochia: appropriate Uterine Fundus: firm  Labs: Lab Results  Component Value Date   WBC 12.7 (H) 10/10/2017   HGB 11.0 (L) 10/10/2017   HCT 32.5 (L) 10/10/2017   MCV 84.9 10/10/2017    PLT 197 10/10/2017   CMP Latest Ref Rng & Units 02/24/2017  Glucose 65 - 99 mg/dL 90  BUN 6 - 20 mg/dL 7  Creatinine 0.44 - 1.00 mg/dL 0.77  Sodium 135 - 145 mmol/L 134(L)  Potassium 3.5 - 5.1 mmol/L 3.6  Chloride 101 - 111 mmol/L 104  CO2 22 - 32 mmol/L 23  Calcium 8.9 - 10.3 mg/dL 8.6(L)  Total Protein 6.5 - 8.1 g/dL 6.6  Total Bilirubin 0.3 - 1.2 mg/dL 0.5  Alkaline Phos 38 - 126 U/L 48  AST 15 - 41 U/L 15  ALT 14 - 54 U/L 9(L)    Discharge instruction: per After Visit Summary and "Baby and Me Booklet".  After visit meds:  Allergies as of 10/11/2017      Reactions   Shellfish Allergy Anaphylaxis   Codeine Other (See Comments)   Reaction:  Sore throat  Contrast Media [iodinated Diagnostic Agents] Nausea And Vomiting   Other Itching, Swelling, Other (See Comments)   Pt is allergic to [place].   Reaction:  Lip swelling       Medication List    TAKE these medications   acetaminophen 500 MG tablet Commonly known as:  TYLENOL Take 500 mg by mouth every 6 (six) hours as needed for mild pain or headache.   albuterol 108 (90 Base) MCG/ACT inhaler Commonly known as:  PROVENTIL HFA;VENTOLIN HFA Proventil HFA 90 mcg/actuation aerosol inhaler  Inhale 2 puffs every 4 hours by inhalation route as needed asthma/wheeze   fluticasone-salmeterol 115-21 MCG/ACT inhaler Commonly known as:  ADVAIR HFA Inhale into the lungs.   ibuprofen 600 MG tablet Commonly known as:  ADVIL,MOTRIN Take 1 tablet (600 mg total) by mouth every 6 (six) hours.   multivitamin-prenatal 27-0.8 MG Tabs tablet Take 1 tablet by mouth daily at 12 noon.       Diet: routine diet  Activity: Advance as tolerated. Pelvic rest for 6 weeks.   Outpatient follow up:6 weeks Follow up Appt:No future appointments. Follow up Visit:No Follow-up on file.  Postpartum contraception: Progesterone only pills  Newborn Data: Live born female  Birth Weight: 7 lb 14.5 oz (3586 g) APGAR: 9, 9  Newborn Delivery    Birth date/time:  10/09/2017 12:04:00 Delivery type:  Vaginal, Spontaneous     Baby Feeding: Breast Disposition:home with mother  Pt plans circumcision with pediatrician  10/11/2017 Barbara Bores, MD

## 2017-10-11 NOTE — Progress Notes (Signed)
Post Partum Day 2 Subjective: no complaints and tolerating PO  Objective: Blood pressure 120/73, pulse 68, temperature 98.2 F (36.8 C), temperature source Oral, resp. rate 18, last menstrual period 01/03/2017, unknown if currently breastfeeding.  Physical Exam:  General: alert and cooperative Lochia: appropriate Uterine Fundus: firm   Recent Labs    10/09/17 1145 10/10/17 0600  HGB 11.6* 11.0*  HCT 34.0* 32.5*    Assessment/Plan: Discharge home  Pt plans circumcision with pediatrician   LOS: 2 days   Barbara Haas 10/11/2017, 10:31 AM

## 2017-10-11 NOTE — Lactation Note (Signed)
This note was copied from a baby's chart. Lactation Consultation Note Mom's 2nd child. Mom BF/Formula fed her 1st child for 3 1/2 months until she returned to work. Mom stated she planned on BR/Formula as well, but hopes to BF longer. Mom stated baby is latching well, but is hungry, that's why she asked for formula. Educated on cluster feeding, supply and demand.  Discussed I&O, breast massage, assessing breast for transfer, pumping, storing milk, supplementing w/BM verses formula, building milk supply, filling, engorgement and prevention. Reminded mom of [facility] services. Mom has no further questions or concerns at this time.  Patient Name: Barbara Haas [id number] Date: 10/11/2017 Reason for consult: Follow-up assessment   Maternal Data    Feeding    LATCH Score       Type of Nipple: Everted at rest and after stimulation           Interventions Interventions: Breast feeding basics reviewed  Lactation Tools Discussed/Used     Consult Status Consult Status: Complete Date: 10/11/17    Theodoro Kalata 10/11/2017, 1:25 AM

## 2017-11-04 IMAGING — CR DG CHEST 2V
2 series · 2 of 2 positions shown · non-contrast
Comparison: None.

CLINICAL DATA: Midsternal chest pain.

EXAM:
CHEST  2 VIEW

[w chest pa]
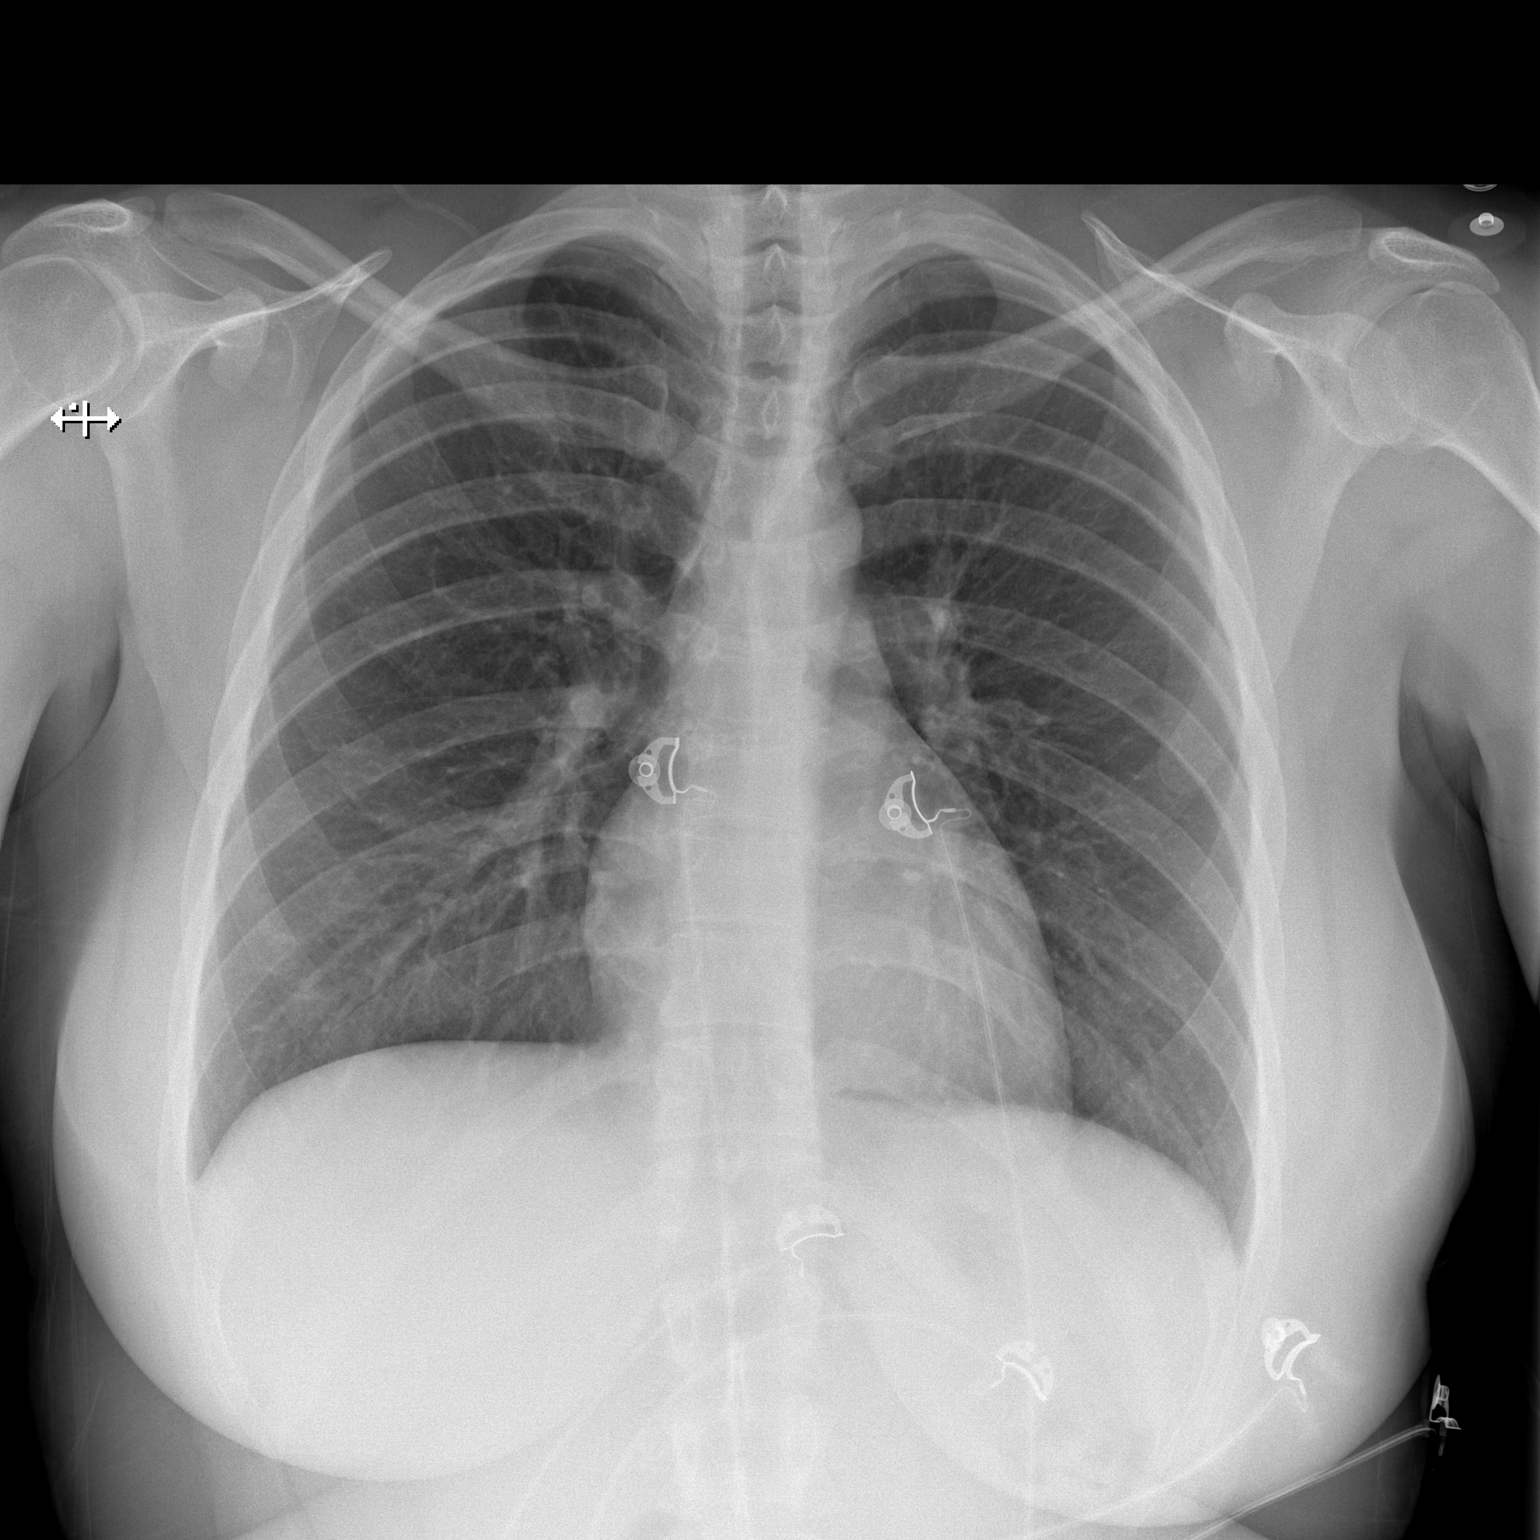

[w chest lat]
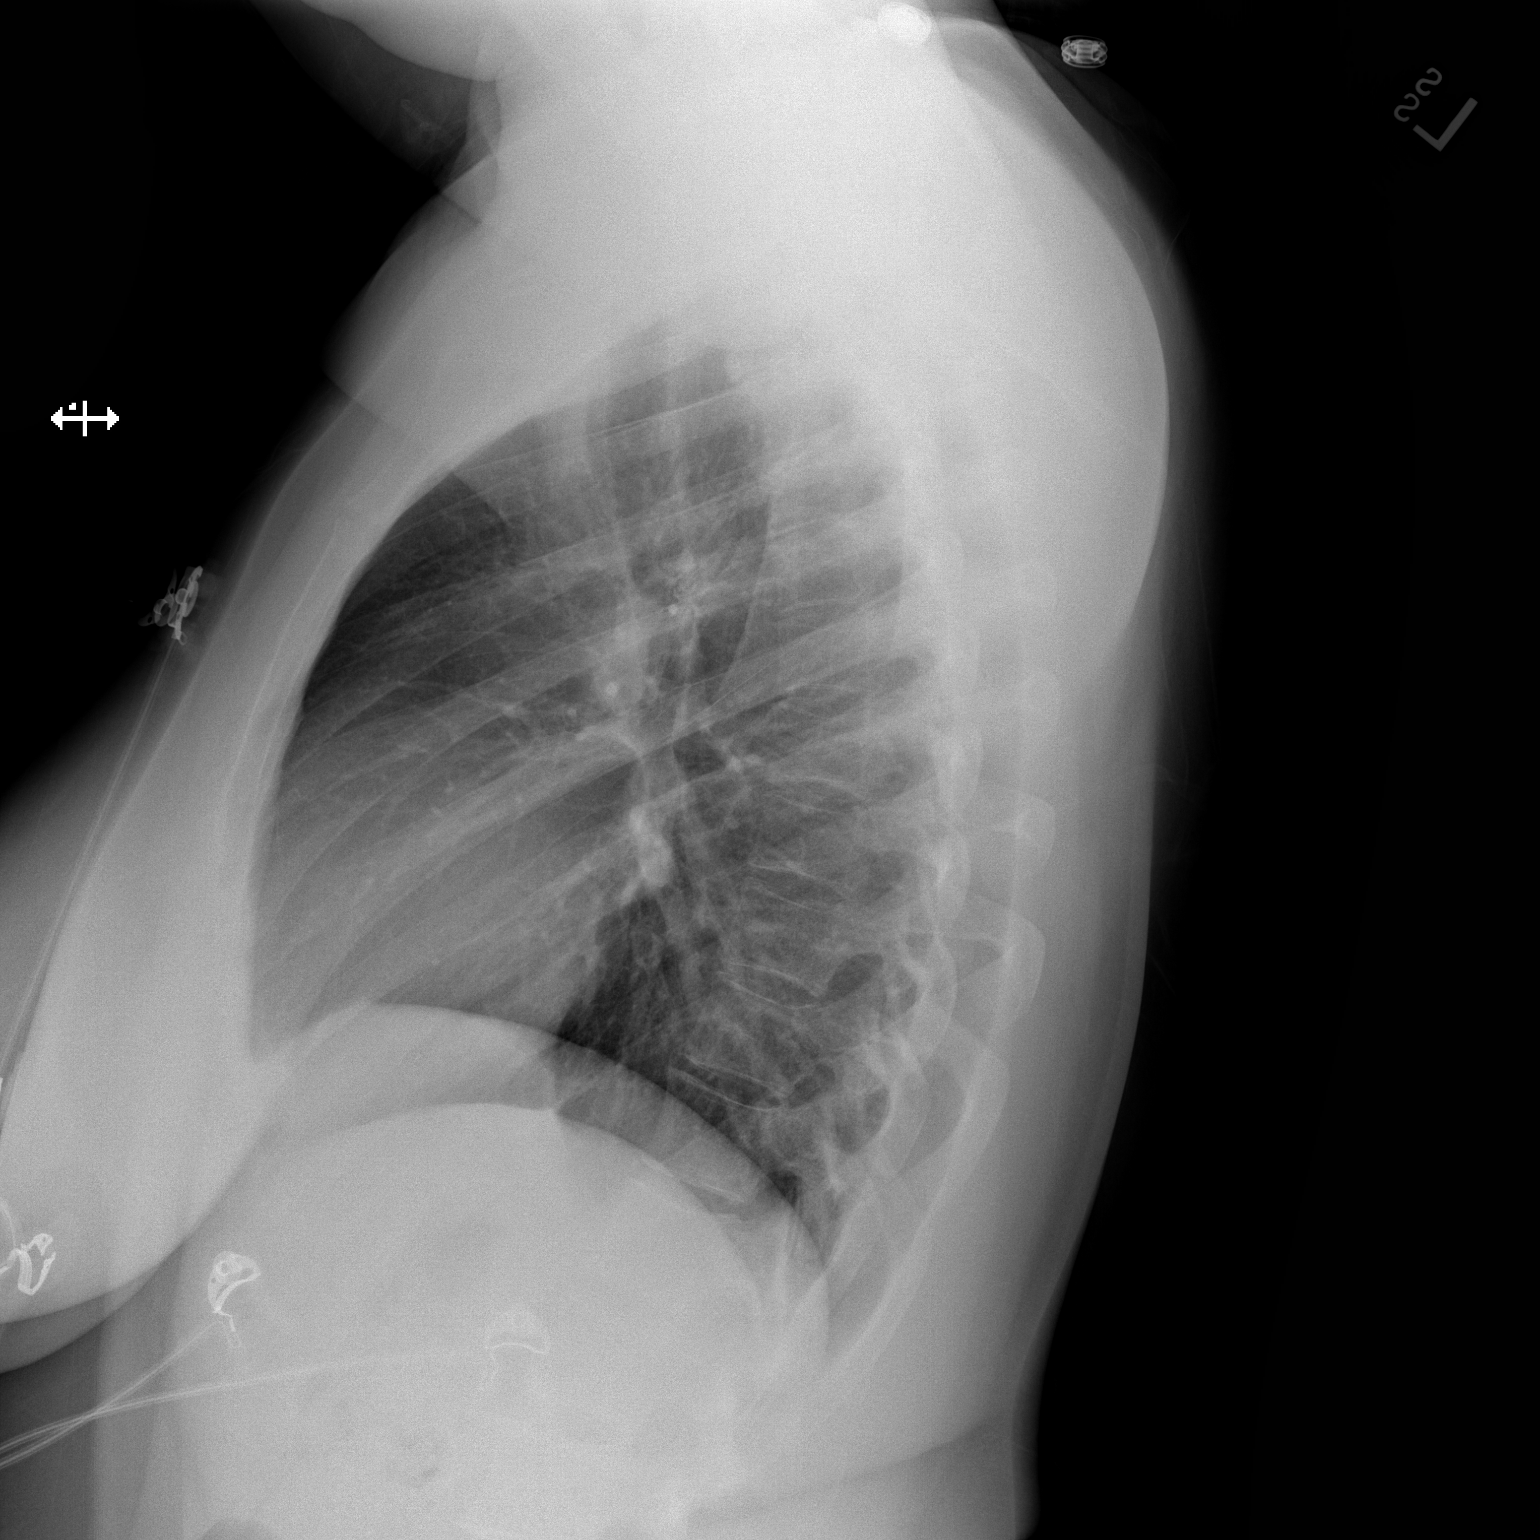

[2 of 2 positions shown; findings below may reference images not displayed]

FINDINGS: Normal heart size and mediastinal contours. No acute infiltrate or
edema. No effusion or pneumothorax. No acute osseous findings.
IMPRESSION: Negative chest.

## 2017-11-04 IMAGING — CT CT ANGIO CHEST
2 of 6 series · 18 of 36 positions shown · IV contrast (isovue)
Comparison: Prior radiograph from earlier the same day.

CLINICAL DATA: Initial evaluation for acute chest pain. Send at 1
he cold made frank

EXAM:
CT ANGIOGRAPHY CHEST WITH CONTRAST
TECHNIQUE: Multidetector CT imaging of the chest was performed using the
standard protocol during bolus administration of intravenous
contrast. Multiplanar CT image reconstructions and MIPs were
obtained to evaluate the vascular anatomy.
CONTRAST:  100 cc of Isovue 370.

[Series 5: coronal mpr · coronal · 0.52mm/px · 1 of 122 slices shown]
[im 61/122  mediastinal]
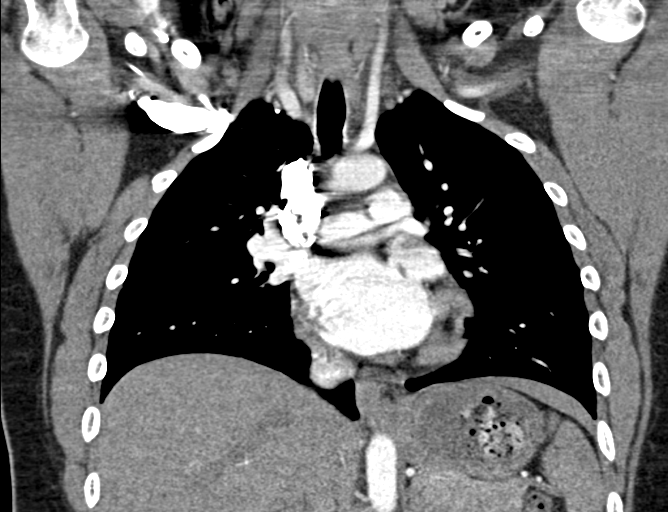

[Series 10: thins for pacs · axial · 0.70mm/px · z∈[-653,-414]mm · 17 of 267 slices shown]
[im 14/267  lung]
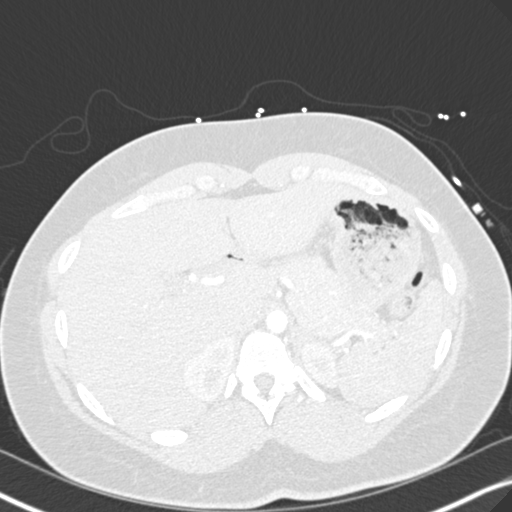
[im 27/267  mediastinal]
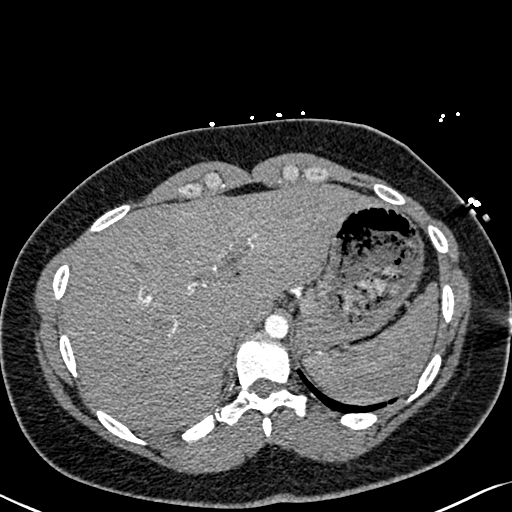
[im 40/267  lung]
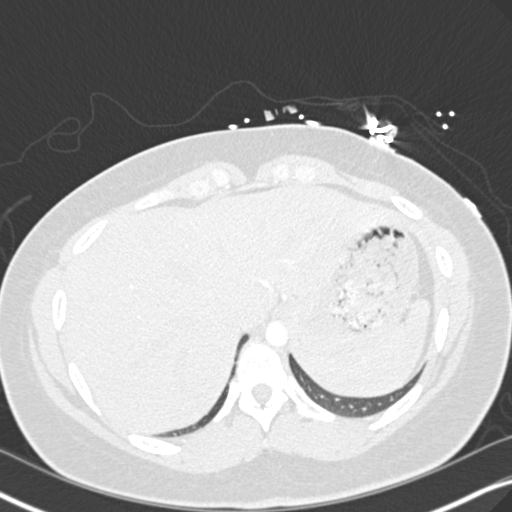
[im 54/267  mediastinal]
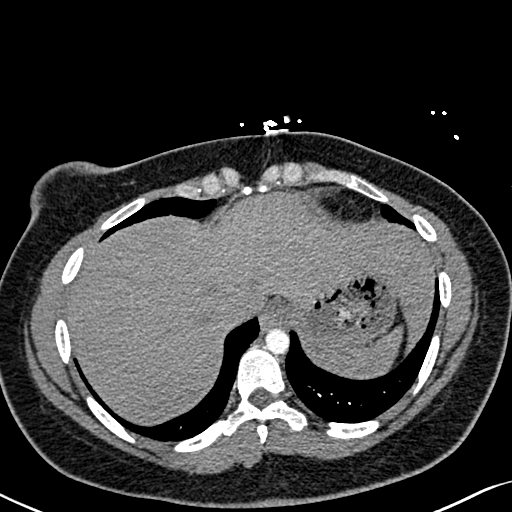
[im 80/267  lung]
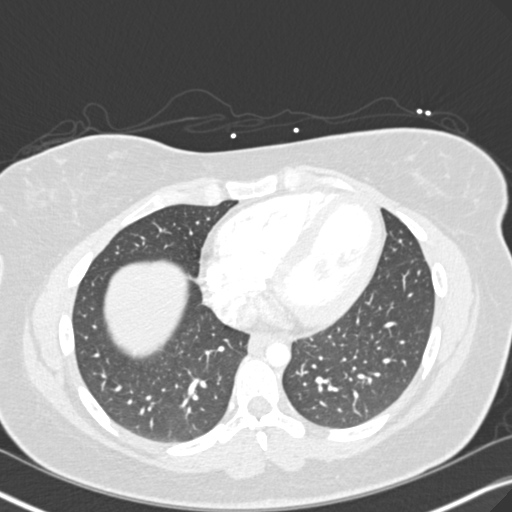
[im 94/267  mediastinal]
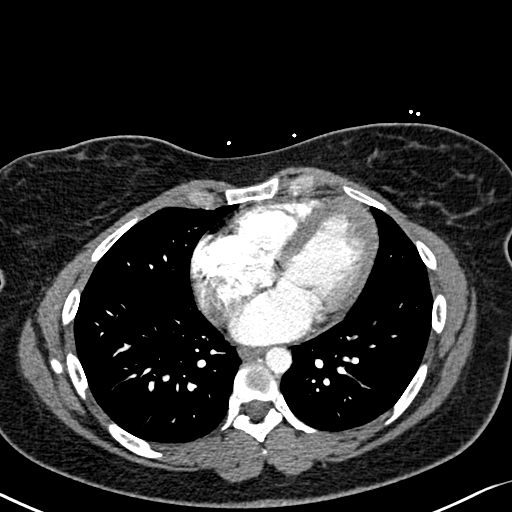
[im 107/267  lung]
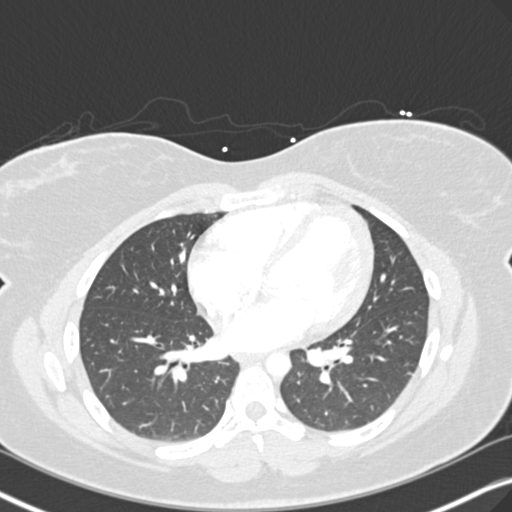
[im 120/267  mediastinal]
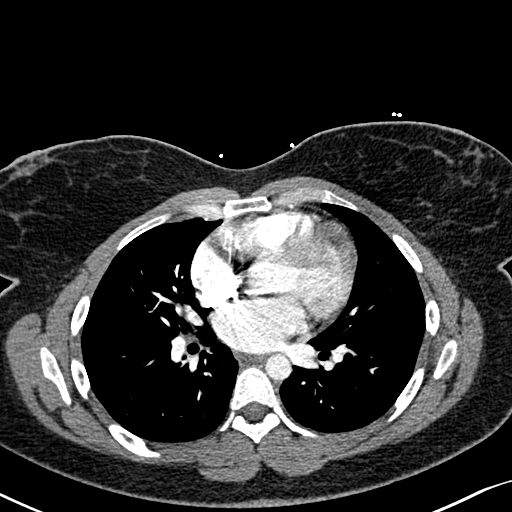
[im 134/267  lung]
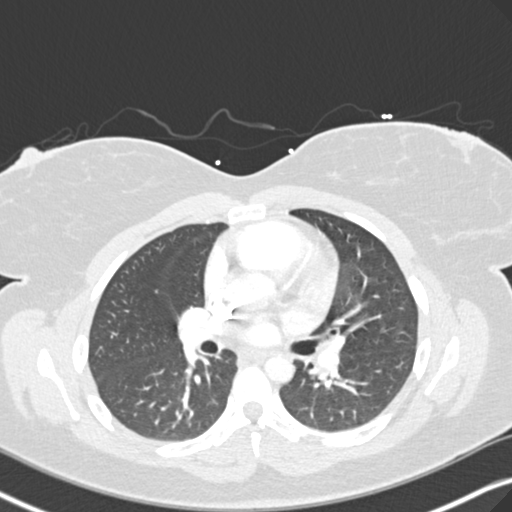
[im 147/267  mediastinal]
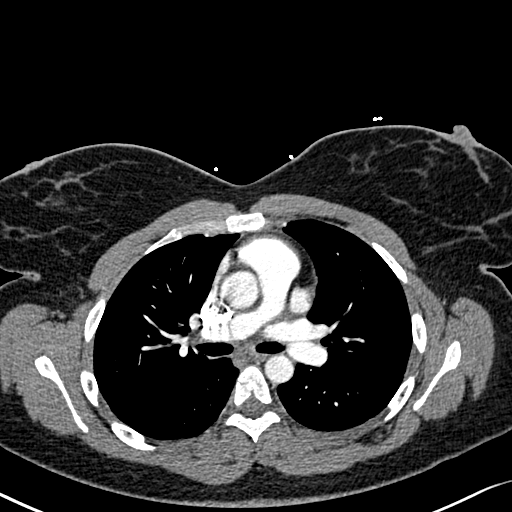
[im 160/267  lung]
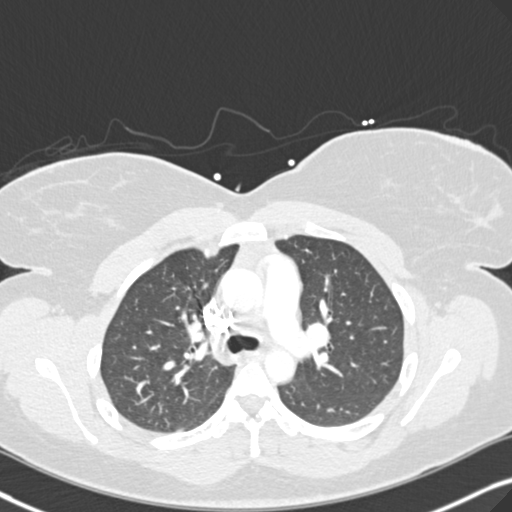
[im 173/267  mediastinal]
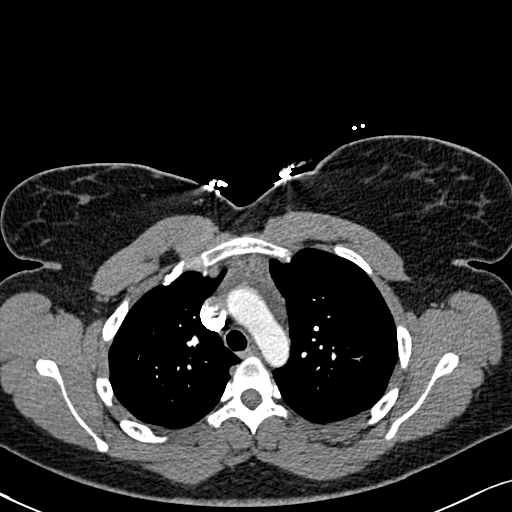
[im 187/267  lung]
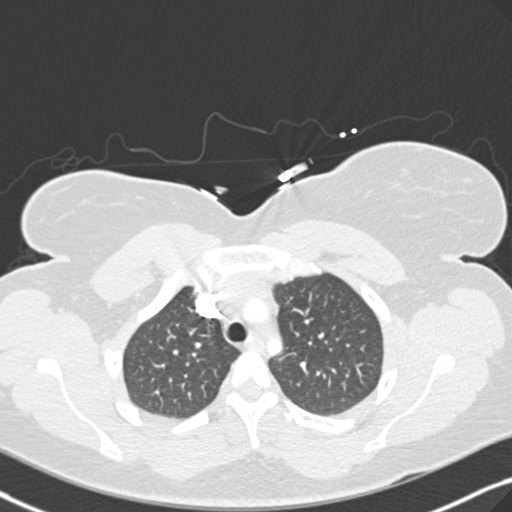
[im 213/267  mediastinal]
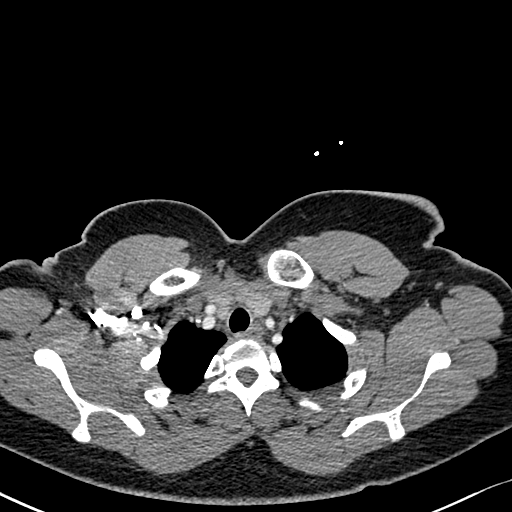
[im 227/267  lung]
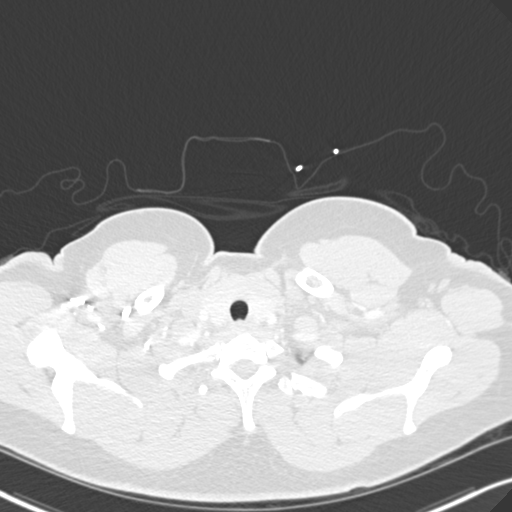
[im 240/267  mediastinal]
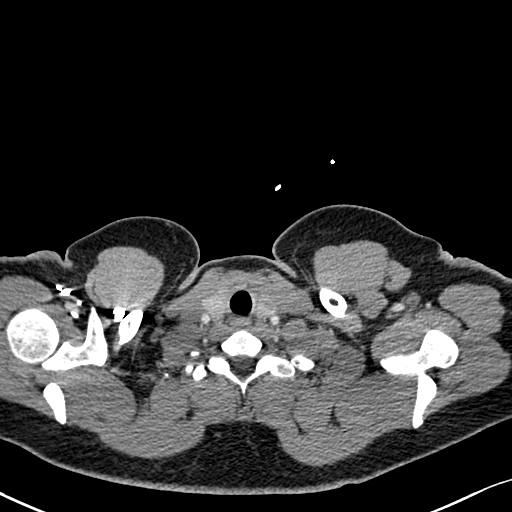
[im 253/267  lung]
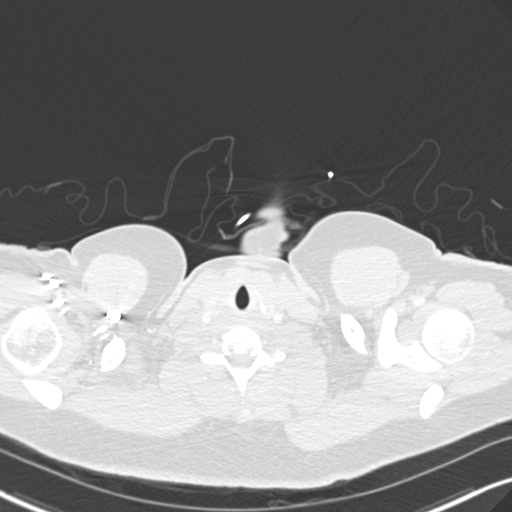

[18 of 36 positions shown; findings below may reference images not displayed]

FINDINGS: Cardiovascular: Intrathoracic aorta of normal caliber and appearance
without acute abnormality. Visualized great vessels within normal
limits.

Heart size within normal limits.  No pericardial effusion.

Pulmonary arterial tree adequately opacified for evaluation. Main
pulmonary artery within normal limits for size. No filling defect to
suggest acute pulmonary embolism identified. Please note evaluation
for possible small distal emboli somewhat limited on this exam due
to respiratory motion artifact.

Mediastinum/Nodes: Thyroid normal. No mediastinal, hilar, or
axillary adenopathy. Soft tissue density within the anterior
mediastinum most compatible with normal residual thymic tissue.
Esophagus normal.

Lungs/Pleura: Lungs are clear. No focal infiltrates identified. No
pulmonary edema or pleural effusion. No pneumothorax. Few
subcentimeter nodular densities within the posterior right lower
lobe measure up to 5 mm, indeterminate. No other worrisome pulmonary
nodule or mass.

Upper Abdomen: Visualized upper abdomen within normal limits.

Musculoskeletal: No acute osseous abnormality. No worrisome lytic or
blastic osseous lesions.

Review of the MIP images confirms the above findings.
IMPRESSION: 1. No CT evidence for acute pulmonary embolism.
2. No other acute cardiopulmonary abnormality identified.
3. Few subpleural nodules measuring up to 5 mm within the posterior
right lower lobe, of uncertain clinical significance. Please note
that Fleischner criteria do not apply in patients of this age.

## 2017-12-24 IMAGING — CR DG CHEST 2V
2 series · 2 of 2 positions shown · non-contrast
Comparison: Chest radiograph and chest CT October 15, 2016

CLINICAL DATA: Cough and sore throat

EXAM:
CHEST  2 VIEW

[w chest pa]
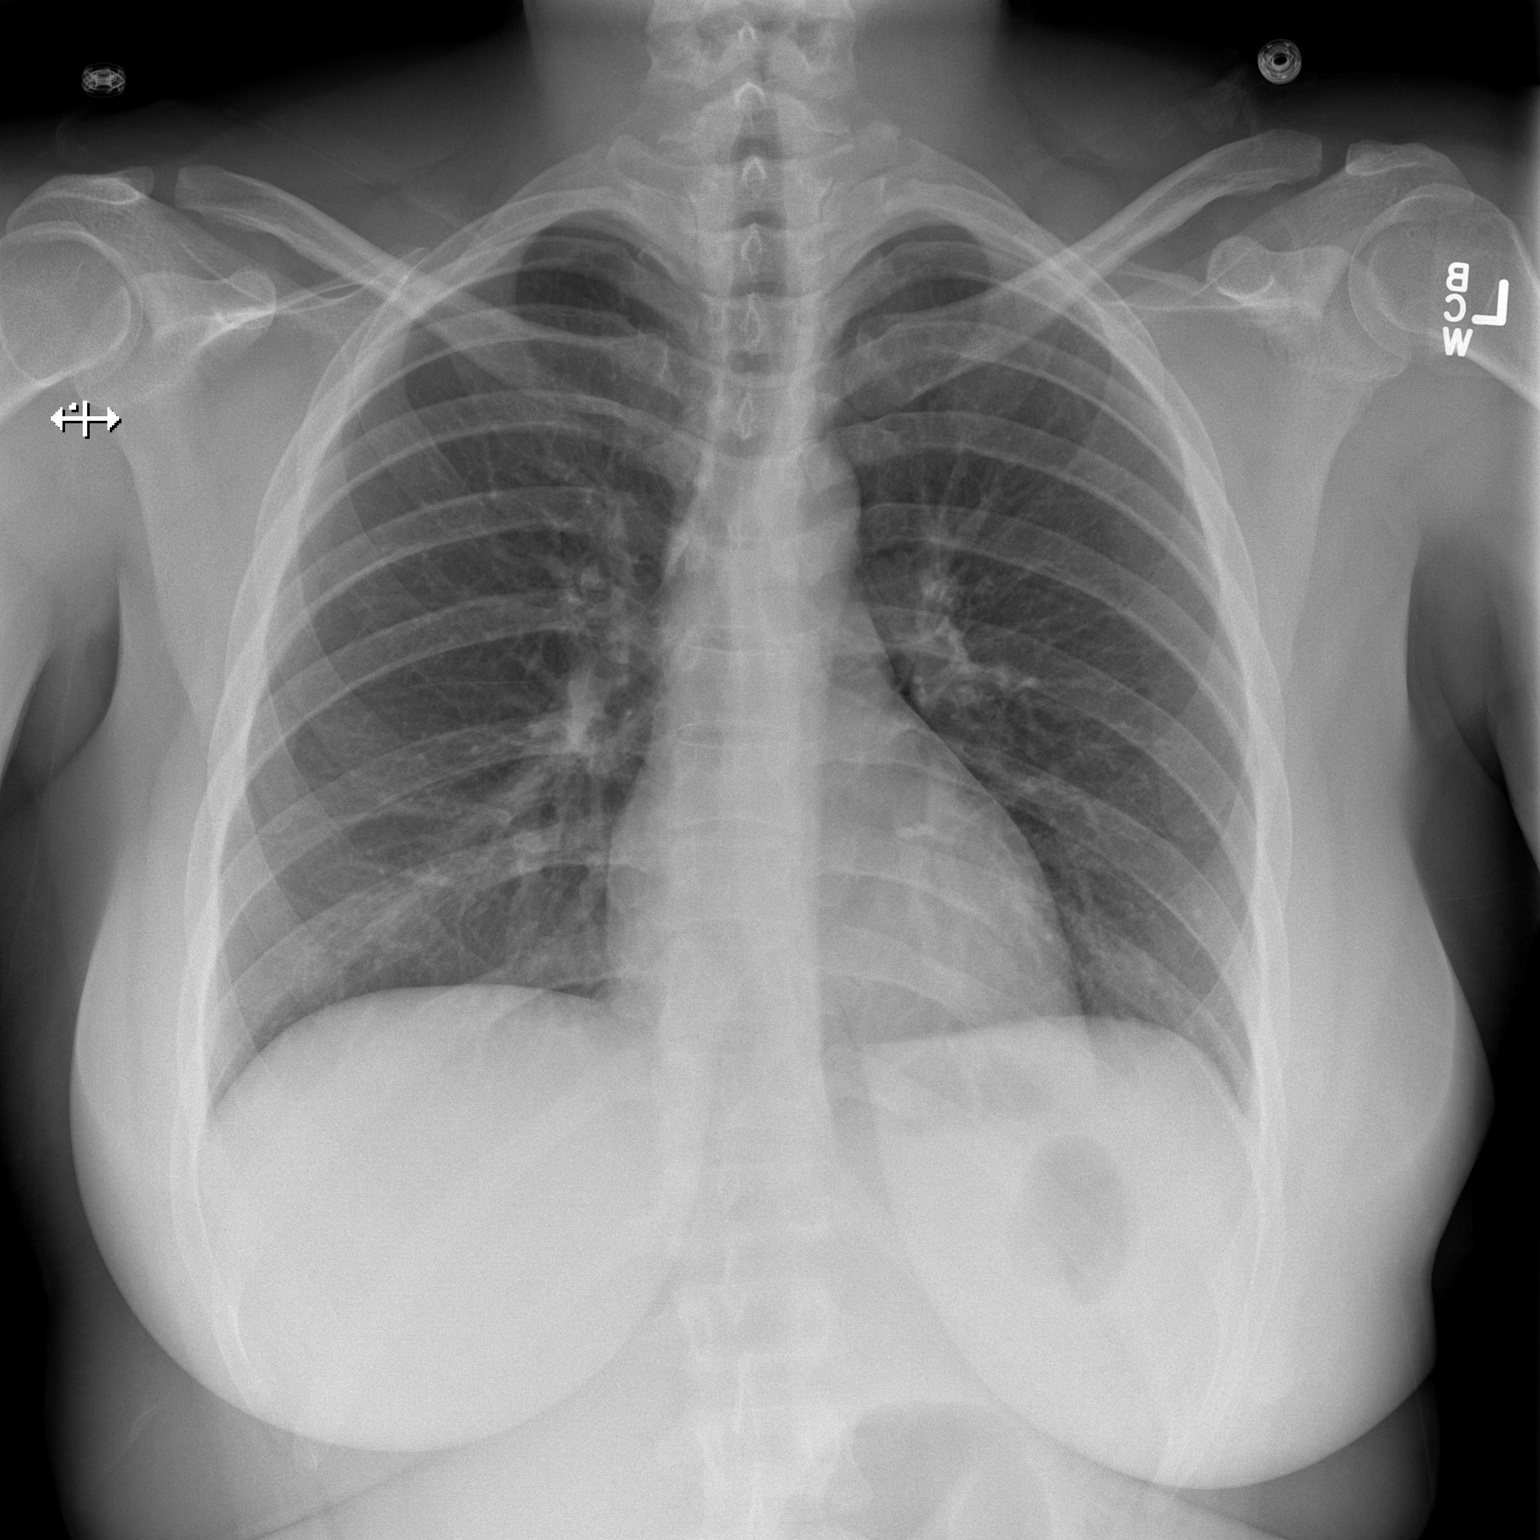

[w chest lat]
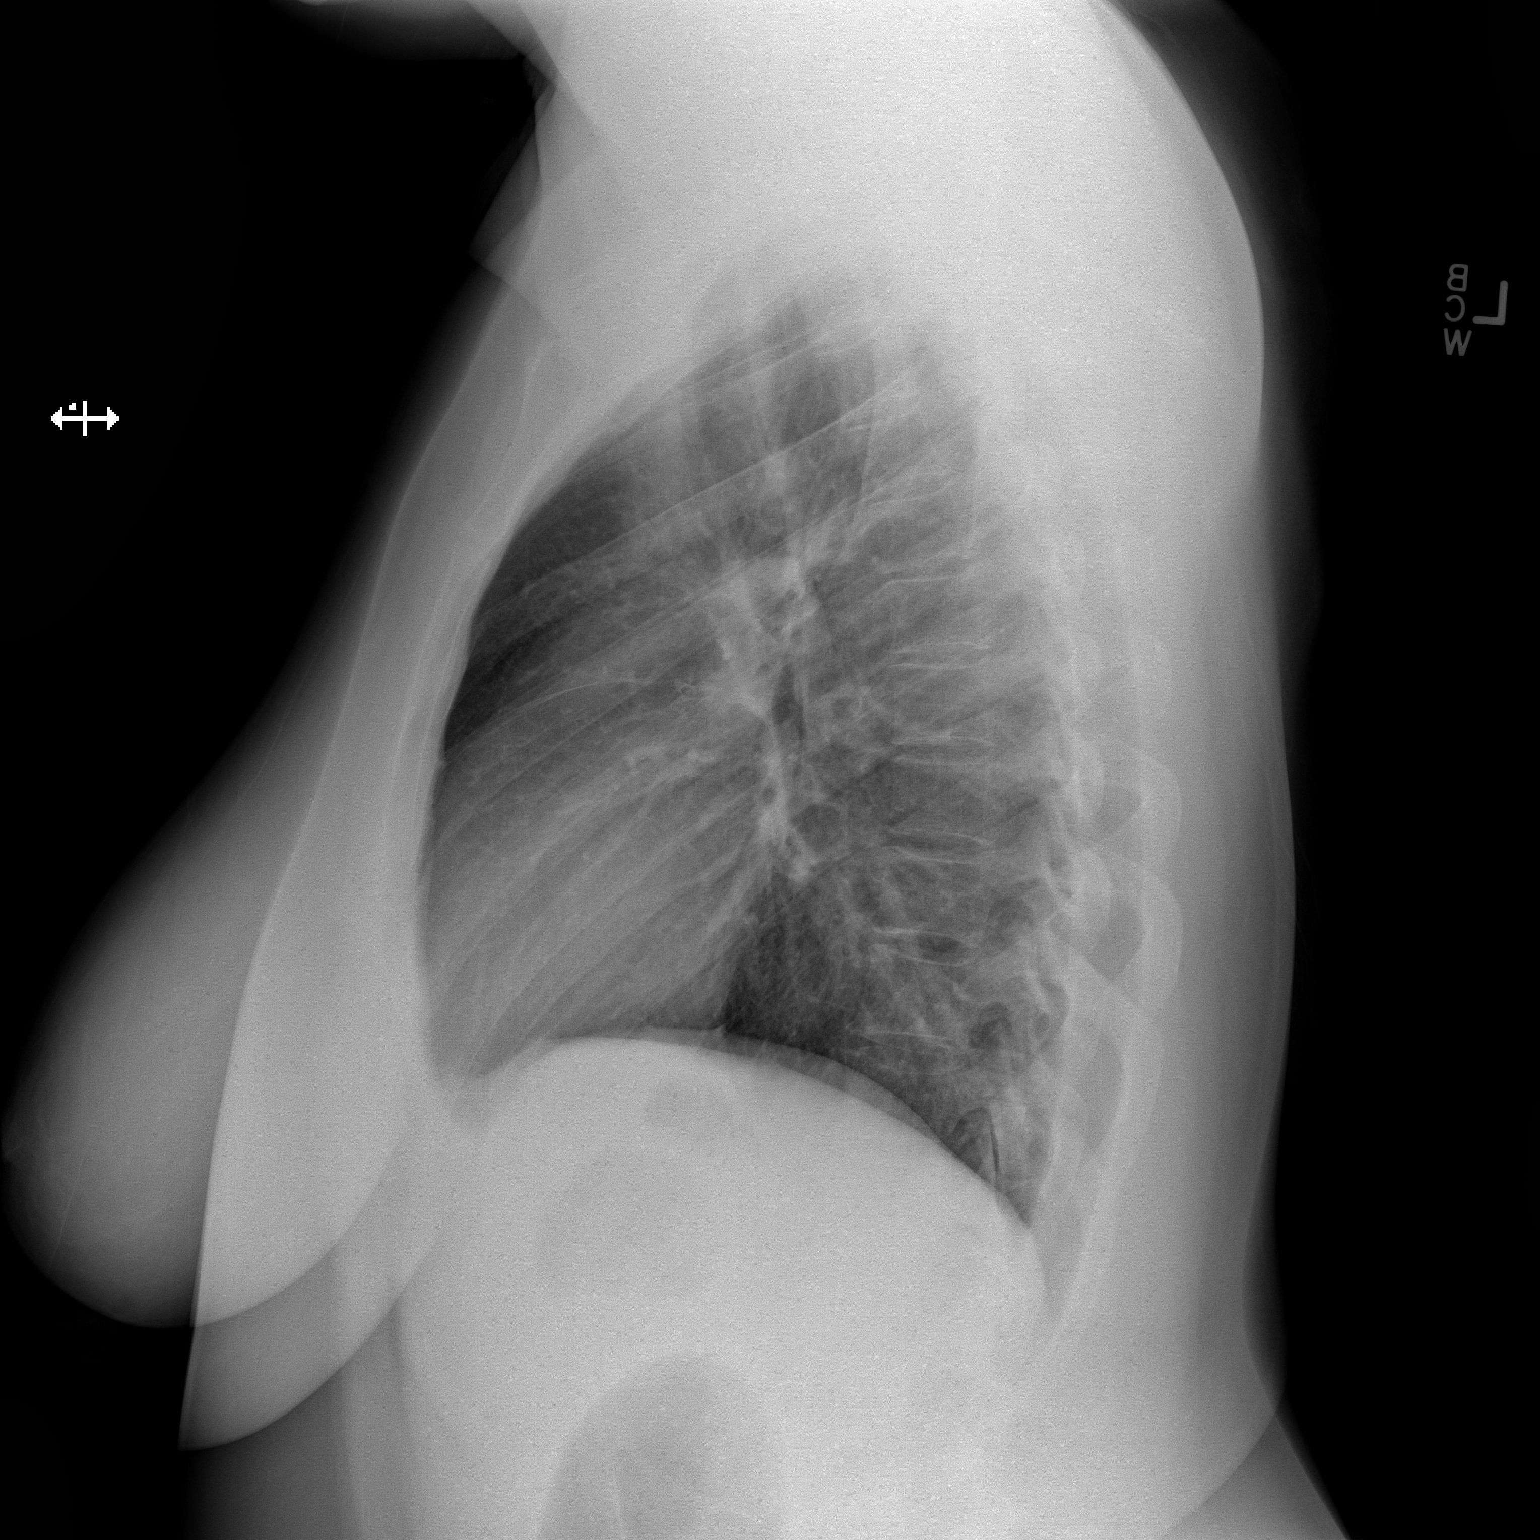

[2 of 2 positions shown; findings below may reference images not displayed]

FINDINGS: Lungs are clear. Heart size and pulmonary vascularity are normal. No
adenopathy. No pneumothorax. No bone lesions.
IMPRESSION: No edema or consolidation.

## 2018-03-16 IMAGING — CR DG CHEST 2V
2 series · 2 of 2 positions shown · non-contrast
Comparison: 12/04/2016

CLINICAL DATA: Right-sided chest pain. Thrombotic thrombocytopenic
Mu. Sickle cell trait.

EXAM:
CHEST  2 VIEW

[chest pa]
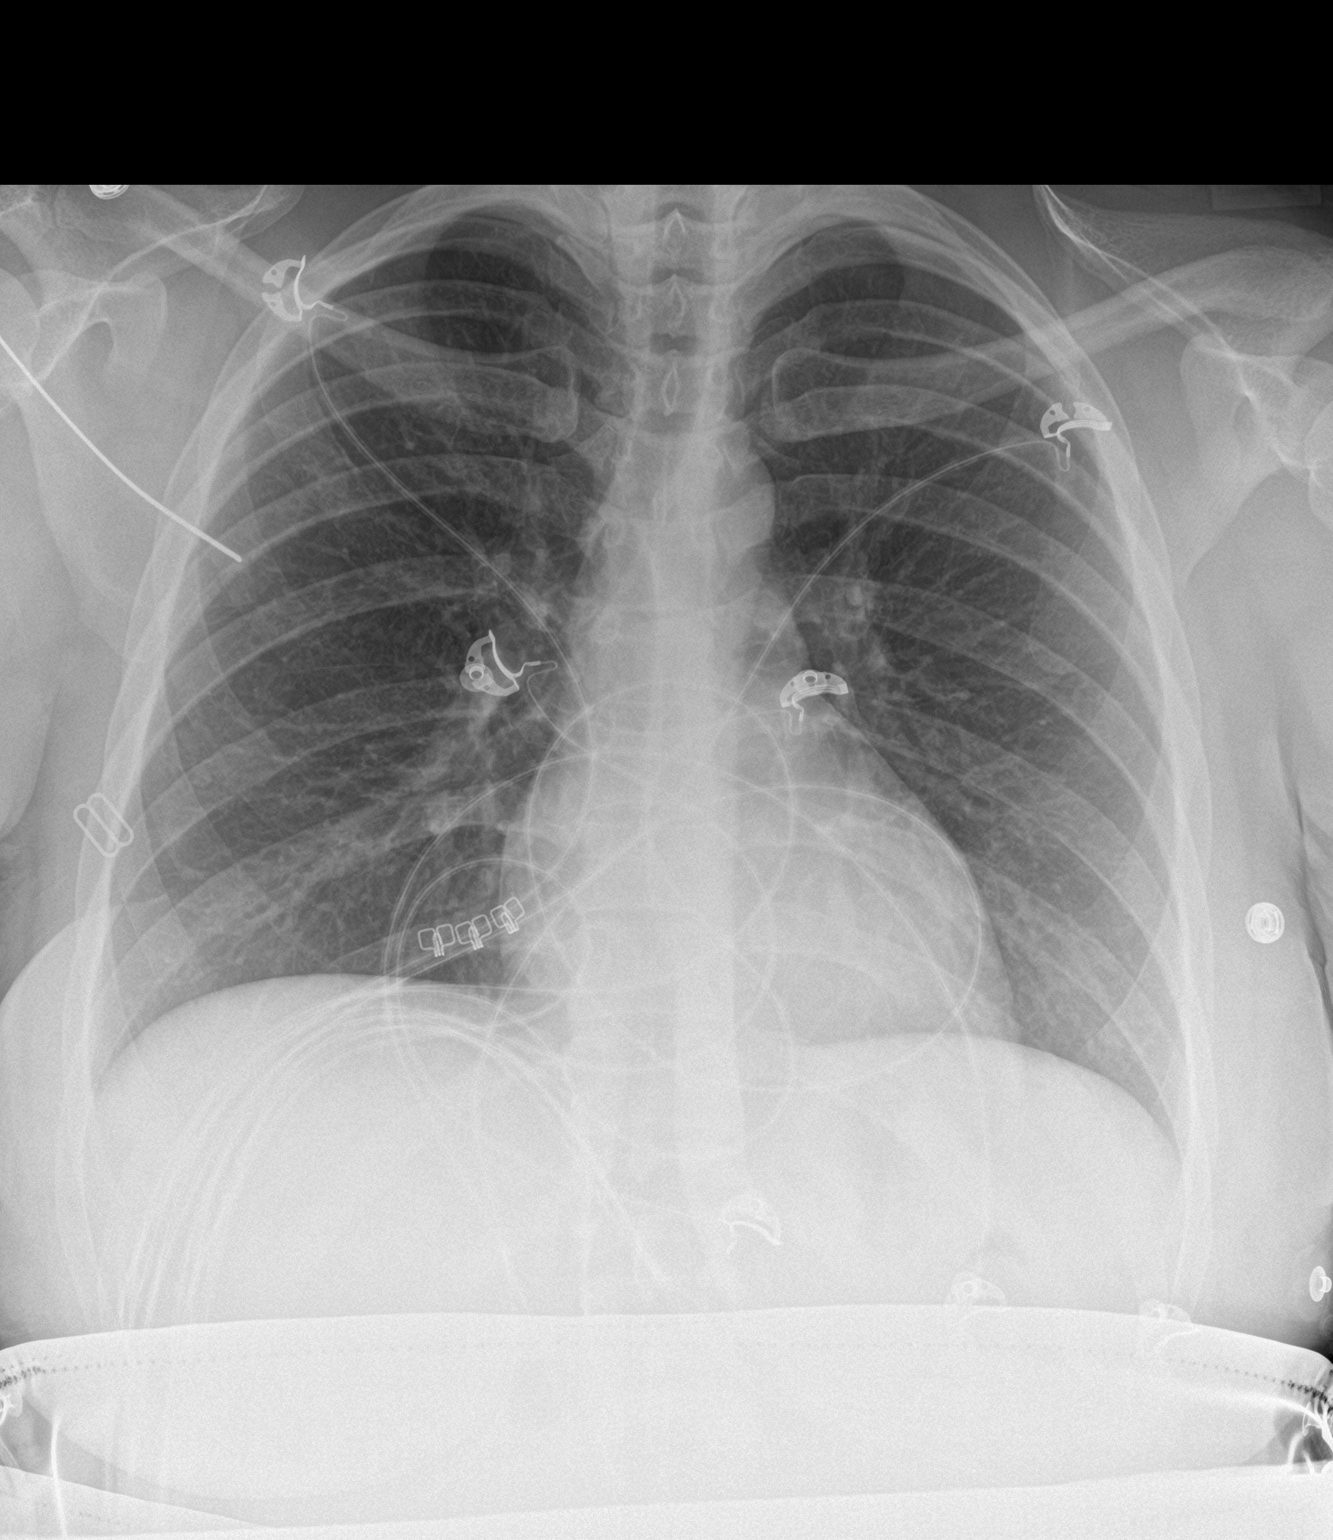

[chest lat]
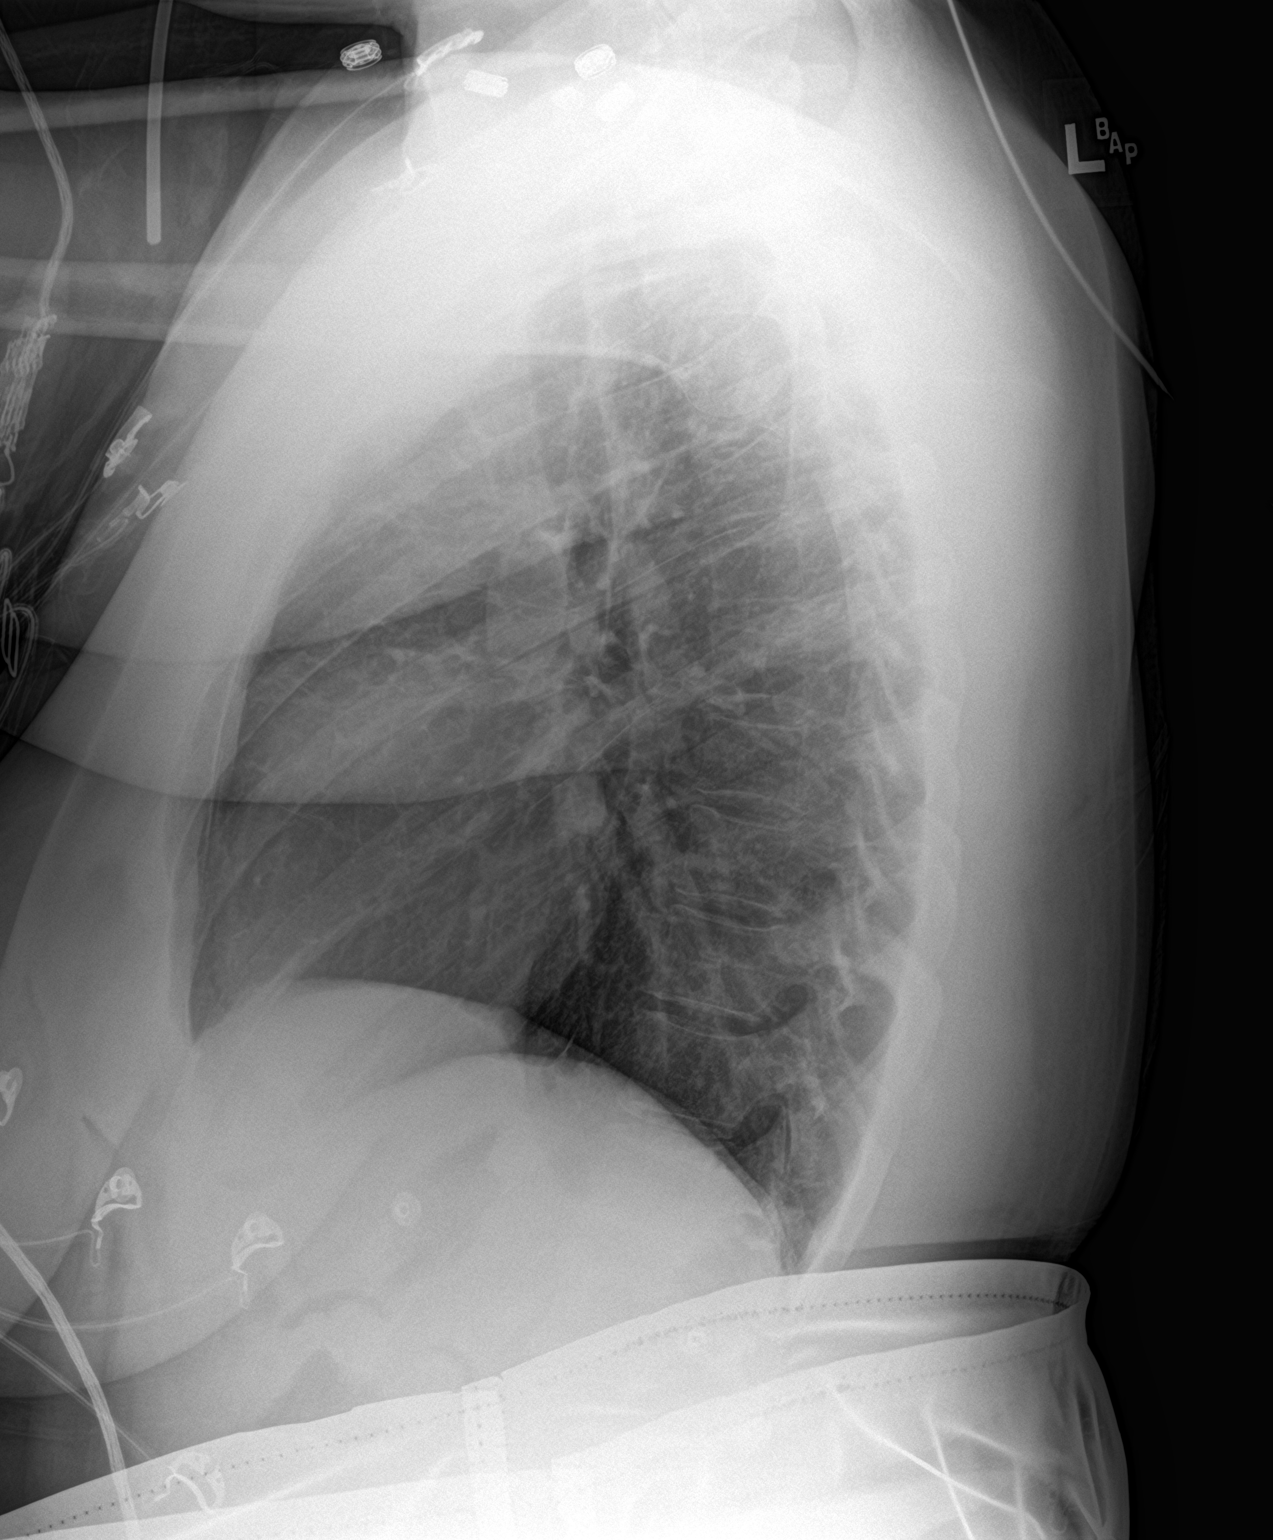

[2 of 2 positions shown; findings below may reference images not displayed]

FINDINGS: The heart size and mediastinal contours are within normal limits.
Both lungs are clear. The visualized skeletal structures are
unremarkable.
IMPRESSION: No active cardiopulmonary disease.

## 2018-10-04 ENCOUNTER — Encounter (HOSPITAL_COMMUNITY): Payer: Self-pay | Admitting: Emergency Medicine

## 2018-10-04 ENCOUNTER — Emergency Department (HOSPITAL_COMMUNITY)
Admission: EM | Admit: 2018-10-04 | Discharge: 2018-10-04 | Disposition: A | Discharge status: Home / Self Care | Payer: Medicaid Other | Attending: Emergency Medicine | Admitting: Emergency Medicine

## 2018-10-04 ENCOUNTER — Emergency Department (HOSPITAL_COMMUNITY): Payer: Medicaid Other

## 2018-10-04 DIAGNOSIS — J45909 Unspecified asthma, uncomplicated: Secondary | ICD-10-CM | POA: Diagnosis not present

## 2018-10-04 DIAGNOSIS — R51 Headache: Secondary | ICD-10-CM | POA: Insufficient documentation

## 2018-10-04 DIAGNOSIS — R1032 Left lower quadrant pain: Secondary | ICD-10-CM | POA: Diagnosis present

## 2018-10-04 DIAGNOSIS — Z79899 Other long term (current) drug therapy: Secondary | ICD-10-CM | POA: Insufficient documentation

## 2018-10-04 DIAGNOSIS — N760 Acute vaginitis: Secondary | ICD-10-CM | POA: Diagnosis not present

## 2018-10-04 DIAGNOSIS — N898 Other specified noninflammatory disorders of vagina: Secondary | ICD-10-CM | POA: Diagnosis not present

## 2018-10-04 DIAGNOSIS — R519 Headache, unspecified: Secondary | ICD-10-CM

## 2018-10-04 DIAGNOSIS — N3001 Acute cystitis with hematuria: Secondary | ICD-10-CM | POA: Insufficient documentation

## 2018-10-04 DIAGNOSIS — B373 Candidiasis of vulva and vagina: Secondary | ICD-10-CM | POA: Diagnosis not present

## 2018-10-04 DIAGNOSIS — B9689 Other specified bacterial agents as the cause of diseases classified elsewhere: Secondary | ICD-10-CM | POA: Diagnosis not present

## 2018-10-04 DIAGNOSIS — B3731 Acute candidiasis of vulva and vagina: Secondary | ICD-10-CM

## 2018-10-04 LAB — URINALYSIS, ROUTINE W REFLEX MICROSCOPIC
Bilirubin Urine: NEGATIVE
Glucose, UA: NEGATIVE mg/dL
Ketones, ur: NEGATIVE mg/dL
Nitrite: NEGATIVE
Protein, ur: NEGATIVE mg/dL
Specific Gravity, Urine: 1.016 (ref 1.005–1.030)
WBC, UA: >50 WBC/hpf — ABNORMAL HIGH (ref 0–5)
pH: 5 (ref 5.0–8.0)

## 2018-10-04 LAB — COMPREHENSIVE METABOLIC PANEL
ALT: 11 U/L (ref 0–44)
AST: 16 U/L (ref 15–41)
Albumin: 3.6 g/dL (ref 3.5–5.0)
Alkaline Phosphatase: 46 U/L (ref 38–126)
Anion gap: 7 (ref 5–15)
BUN: 12 mg/dL (ref 6–20)
CO2: 25 mmol/L (ref 22–32)
Calcium: 8.5 mg/dL — ABNORMAL LOW (ref 8.9–10.3)
Chloride: 106 mmol/L (ref 98–111)
Creatinine, Ser: 0.87 mg/dL (ref 0.44–1.00)
GFR calc Af Amer: >60 mL/min (ref >60)
GFR calc non Af Amer: >60 mL/min (ref >60)
Glucose, Bld: 97 mg/dL (ref 70–99)
Potassium: 3.9 mmol/L (ref 3.5–5.1)
Sodium: 138 mmol/L (ref 135–145)
Total Bilirubin: 0.6 mg/dL (ref 0.3–1.2)
Total Protein: 7 g/dL (ref 6.5–8.1)

## 2018-10-04 LAB — CBC WITH DIFFERENTIAL/PLATELET
Abs Immature Granulocytes: 0.01 10*3/uL (ref 0.00–0.07)
Basophils Absolute: 0 10*3/uL (ref 0.0–0.1)
Basophils Relative: 0 %
Eosinophils Absolute: 0.1 10*3/uL (ref 0.0–0.5)
Eosinophils Relative: 2 %
HCT: 38.3 % (ref 36.0–46.0)
Hemoglobin: 12.4 g/dL (ref 12.0–15.0)
Immature Granulocytes: 0 %
Lymphocytes Relative: 27 %
Lymphs Abs: 1.4 10*3/uL (ref 0.7–4.0)
MCH: 28.2 pg (ref 26.0–34.0)
MCHC: 32.4 g/dL (ref 30.0–36.0)
MCV: 87.2 fL (ref 80.0–100.0)
Monocytes Absolute: 0.2 10*3/uL (ref 0.1–1.0)
Monocytes Relative: 4 %
Neutro Abs: 3.3 10*3/uL (ref 1.7–7.7)
Neutrophils Relative %: 67 %
Platelets: 232 10*3/uL (ref 150–400)
RBC: 4.39 MIL/uL (ref 3.87–5.11)
RDW: 14 % (ref 11.5–15.5)
WBC: 5 10*3/uL (ref 4.0–10.5)
nRBC: 0 % (ref 0.0–0.2)

## 2018-10-04 LAB — I-STAT BETA HCG BLOOD, ED (MC, WL, AP ONLY): I-stat hCG, quantitative: <5 m[IU]/mL (ref <5)

## 2018-10-04 LAB — WET PREP, GENITAL
Sperm: NONE SEEN
Trich, Wet Prep: NONE SEEN

## 2018-10-04 LAB — LIPASE, BLOOD: Lipase: 30 U/L (ref 11–51)

## 2018-10-04 MED ORDER — METRONIDAZOLE 500 MG PO TABS: 500.0000 mg | ORAL_TABLET | Freq: Two times a day (BID) | ORAL | 0 refills | Status: DC

## 2018-10-04 MED ORDER — METOCLOPRAMIDE HCL 5 MG/ML IJ SOLN
10.0000 mg | Freq: Once | INTRAMUSCULAR | Status: AC
  Administered 2018-10-04: 10 mg via INTRAVENOUS
  Filled 2018-10-04: qty 2

## 2018-10-04 MED ORDER — FLUCONAZOLE 150 MG PO TABS: 150.0000 mg | ORAL_TABLET | Freq: Every day | ORAL | 0 refills | Status: AC

## 2018-10-04 MED ORDER — SODIUM CHLORIDE 0.9 % IV SOLN
1.0000 g | Freq: Once | INTRAVENOUS | Status: AC
  Administered 2018-10-04: 1 g via INTRAVENOUS
  Filled 2018-10-04: qty 10

## 2018-10-04 MED ORDER — SODIUM CHLORIDE 0.9 % IV BOLUS
500.0000 mL | Freq: Once | INTRAVENOUS | Status: AC
  Administered 2018-10-04: 500 mL via INTRAVENOUS

## 2018-10-04 MED ORDER — CEPHALEXIN 500 MG PO CAPS: 500.0000 mg | ORAL_CAPSULE | Freq: Two times a day (BID) | ORAL | 0 refills | Status: DC

## 2018-10-04 NOTE — Discharge Instructions (Addendum)
Take Keflex until completed, unless you are called after urine culture results and a different antibiotic is required.  Take Diflucan once and repeat if you continue to have yeast infection symptoms in 3 days.  Take Flagyl until completed for bacterial vaginosis. You can take ibuprofen as prescribed over-the-counter, as needed for your headaches or chest pain.  Please follow-up with neurology for further evaluation of your ongoing headaches.  You can establish care with a primary care provider by calling the number circled below.  Y Please return the emergency department if you develop any new or worsening symptoms.

## 2018-10-04 NOTE — ED Triage Notes (Signed)
Pt c/o headache and abd pains for several weeks. Reports since Friday had blood in urine.

## 2018-10-04 NOTE — ED Provider Notes (Signed)
[place] DEPT Provider Note   CSN: [id number] Arrival date & time: 10/04/18  0920     History   Chief Complaint Chief Complaint  Patient presents with  . Headache  . Abdominal Pain  . Hematuria    HPI Barbara Haas is a 27 y.o. female with history of sickle cell trait, endometriosis, asthma, anxiety who presents with headache, left lower abdominal pain, and hematuria.  She reports that her headache has been coming and going more frequently over the past 5 to 6 weeks.  She reports having headaches for years, however they have been coming and going more than they used to.  She describes them as the back of her head into the left side.  She has had associated photophobia and nausea.  She has never been evaluated by neurology or diagnosed with migraines.  She reports they were never that bothersome.  She reports Aleve and caffeine has helped to some degree.  She reports they start slowly and increase.  Are not sudden onset.  She has no fevers or neck pain.  Regarding patient's abdominal pain, she states that is sometimes sharp and crampy.  She has history of ovarian cyst and reports it feels similar.  She denies any vomiting, diarrhea, or constipation.  She reports a 3-day history of a light tinge of blood in her urine.  She denies any vaginal bleeding.  She reports she is on a birth control pill that gives her a period every 3 months.  She notes she has had some vaginal discharge, but she has history of BV.  She reports she was taking medication for this, however she stopped and is wondering if that may be the cause.  Patient also reports some tenderness on her right side of her chest.  She denies any chest pain at rest, only when pressing on it or her 37-month-old child pushes on it.  Patient does report lifting her child a lot and lifting at work.  She denies any shortness of breath or pleuritic pain.  She denies any recent long trips, surgeries, known  cancer, history of blood clot.  Patient is on OCP as stated above.  HPI  Past Medical History:  Diagnosis Date  . Anxiety   . Asthma   . Back pain affecting pregnancy 03/11/2017  . Complication of anesthesia    bp drops  . Endometriosis   . Headache 03/11/2017  . Nausea and vomiting during pregnancy prior to [redacted] weeks gestation 03/11/2017  . Neck pain 03/11/2017  . Sickle cell trait [facility])     Patient Active Problem List   Diagnosis Date Noted  . Indication for care in labor or delivery 10/09/2017  . Spontaneous vaginal delivery 10/09/2017  . Nausea and vomiting during pregnancy prior to [redacted] weeks gestation 03/11/2017  . Back pain affecting pregnancy 03/11/2017  . Headache 03/11/2017  . Neck pain 03/11/2017  . SVD (spontaneous vaginal delivery) 12/22/2015  . Postpartum care following vaginal delivery 12/22/2015  . Indication for care in labor and delivery, antepartum 12/21/2015    Past Surgical History:  Procedure Laterality Date  . OOPHORECTOMY     left  . OVARIAN CYST REMOVAL    . WISDOM TOOTH EXTRACTION       OB History    Gravida  3   Para  2   Term  2   Preterm      AB  1   Living  2     SAB  1   TAB      Ectopic      Multiple  0   Live Births  2            Home Medications    Prior to Admission medications   Medication Sig Start Date End Date Taking? Authorizing Provider  albuterol (PROVENTIL HFA;VENTOLIN HFA) 108 (90 Base) MCG/ACT inhaler Inhale 2 puffs into the lungs every 4 (four) hours as needed for wheezing or shortness of breath.    Yes [provider]  fluticasone-salmeterol (ADVAIR HFA) 115-21 MCG/ACT inhaler Inhale 2 puffs into the lungs 2 (two) times daily as needed (for shortness of breath).    Yes [provider]  ibuprofen (ADVIL,MOTRIN) 200 MG tablet Take 800 mg by mouth every 6 (six) hours as needed for headache or moderate pain.   Yes [provider]  Levonorgestrel-Ethinyl Estradiol  (AMETHIA,CAMRESE) 0.15-0.03 &0.01 MG tablet Take 1 tablet by mouth daily. 12/08/17  Yes [provider]  naproxen sodium (ALEVE) 220 MG tablet Take 880 mg by mouth daily as needed (for pain or headache).   Yes [provider]  Prenatal Vit-Fe Fumarate-FA (MULTIVITAMIN-PRENATAL) 27-0.8 MG TABS tablet Take 1 tablet by mouth daily.    Yes [provider]  cephALEXin (KEFLEX) 500 MG capsule Take 1 capsule (500 mg total) by mouth 2 (two) times daily. 10/04/18   Novie Maggio, Bea Graff, PA-C  fluconazole (DIFLUCAN) 150 MG tablet Take 1 tablet (150 mg total) by mouth daily for 2 doses. Only take the second dose if symptoms are continuing. 10/04/18 10/06/18  Frederica Kuster, PA-C  metroNIDAZOLE (FLAGYL) 500 MG tablet Take 1 tablet (500 mg total) by mouth 2 (two) times daily. 10/04/18   Frederica Kuster, PA-C    Family History No family history on file.  Social History Social History   Tobacco Use  . Smoking status: Never Smoker  . Smokeless tobacco: Never Used  Substance Use Topics  . Alcohol use: Yes  . Drug use: No     Allergies   Shellfish allergy; Codeine; Contrast media [iodinated diagnostic agents]; and Other   Review of Systems Review of Systems  Constitutional: Negative for chills and fever.  HENT: Negative for facial swelling and sore throat.   Eyes: Positive for photophobia.  Respiratory: Negative for shortness of breath.   Cardiovascular: Positive for chest pain (only with palpation).  Gastrointestinal: Positive for abdominal pain. Negative for blood in stool, constipation, diarrhea, nausea and vomiting.  Genitourinary: Positive for hematuria and vaginal discharge. Negative for dysuria.  Musculoskeletal: Negative for back pain and neck pain.  Skin: Negative for rash and wound.  Neurological: Positive for headaches.  Psychiatric/Behavioral: The patient is not nervous/anxious.      Physical Exam Updated Vital Signs BP (!) 141/93   Pulse 76   Temp  97.9 F (36.6 C) (Oral)   Resp 18   LMP 08/21/2018   SpO2 100%   Physical Exam Vitals signs and nursing note reviewed. Exam conducted with a chaperone present.  Constitutional:      General: She is not in acute distress.    Appearance: She is well-developed. She is not diaphoretic.  HENT:     Head: Normocephalic and atraumatic.     Mouth/Throat:     Pharynx: No oropharyngeal exudate.  Eyes:     General: No scleral icterus.       Right eye: No discharge.        Left eye: No discharge.  Conjunctiva/sclera: Conjunctivae normal.     Pupils: Pupils are equal, round, and reactive to light.  Neck:     Musculoskeletal: Normal range of motion and neck supple.     Thyroid: No thyromegaly.  Cardiovascular:     Rate and Rhythm: Normal rate and regular rhythm.     Heart sounds: Normal heart sounds. No murmur. No friction rub. No gallop.   Pulmonary:     Effort: Pulmonary effort is normal. No respiratory distress.     Breath sounds: Normal breath sounds. No stridor. No wheezing or rales.  Abdominal:     General: Bowel sounds are normal. There is no distension.     Palpations: Abdomen is soft.     Tenderness: There is abdominal tenderness in the left lower quadrant. There is no right CVA tenderness, left CVA tenderness, guarding or rebound. Negative signs include Murphy's sign and McBurney's sign.  Genitourinary:    Vagina: Bleeding (minimal) present.     Cervix: Cervical bleeding (minimal) present. No cervical motion tenderness.     Uterus: Tender (could be bladder s/s UTI).      Adnexa:        Right: Tenderness present.        Left: Tenderness present.   Lymphadenopathy:     Cervical: No cervical adenopathy.  Skin:    General: Skin is warm and dry.     Coloration: Skin is not pale.     Findings: No rash.  Neurological:     Mental Status: She is alert.     GCS: GCS eye subscore is 4. GCS verbal subscore is 5. GCS motor subscore is 6.     Coordination: Coordination normal.      Comments: CN 3-12 intact; normal sensation throughout; 5/5 strength in all 4 extremities; equal bilateral grip strength; no ataxia on finger-to-nose      ED Treatments / Results  Labs (all labs ordered are listed, but only abnormal results are displayed) Labs Reviewed  WET PREP, GENITAL - Abnormal; Notable for the following components:      Result Value   Yeast Wet Prep HPF POC PRESENT (*)    Clue Cells Wet Prep HPF POC PRESENT (*)    WBC, Wet Prep HPF POC FEW (*)    All other components within normal limits  COMPREHENSIVE METABOLIC PANEL - Abnormal; Notable for the following components:   Calcium 8.5 (*)    All other components within normal limits  URINALYSIS, ROUTINE W REFLEX MICROSCOPIC - Abnormal; Notable for the following components:   APPearance CLOUDY (*)    Hgb urine dipstick LARGE (*)    Leukocytes, UA LARGE (*)    WBC, UA >50 (*)    Bacteria, UA MANY (*)    Crystals PRESENT (*)    All other components within normal limits  URINE CULTURE  CBC WITH DIFFERENTIAL/PLATELET  LIPASE, BLOOD  I-STAT BETA HCG BLOOD, ED ([facility], [facility], [facility] ONLY)  GC/CHLAMYDIA PROBE AMP ([place]) NOT AT [facility]    EKG EKG Interpretation  Date/Time:  Sunday October 04 2018 10:15:56 EST Ventricular Rate:  70 PR Interval:    QRS Duration: 80 QT Interval:  368 QTC Calculation: 397 R Axis:   74 Text Interpretation:  Sinus rhythm Probable left atrial enlargement Artifact otherwise no significant change since May 2018 Confirmed by Sherwood Gambler [phone]) on 10/04/2018 10:24:57 AM Also confirmed by Sherwood Gambler [phone]), editor Philomena Doheny [phone])  on 10/04/2018 12:30:27 PM   Radiology Dg Chest 2 View  Result Date: 10/04/2018 CLINICAL DATA:  Headache and abdominal pain for several weeks, RIGHT chest pain on palpation, blood in urine since Friday, history of asthma EXAM: CHEST - 2 VIEW COMPARISON:  02/24/2017 FINDINGS: Normal heart size, mediastinal contours, and pulmonary vascularity. Lungs  clear. No pleural effusion or pneumothorax. Bones unremarkable. IMPRESSION: Normal exam. Electronically Signed   By: Lavonia Dana M.D.   On: 10/04/2018 10:54   US Transvaginal Non-ob  Result Date: 10/04/2018 CLINICAL DATA:  Left lower quadrant pain. Bilateral adnexal tenderness on exam. Endometriosis. Previous left oophorectomy. EXAM: TRANSABDOMINAL AND TRANSVAGINAL ULTRASOUND OF PELVIS DOPPLER ULTRASOUND OF OVARIES TECHNIQUE: Both transabdominal and transvaginal ultrasound examinations of the pelvis were performed. Transabdominal technique was performed for global imaging of the pelvis including uterus, ovaries, adnexal regions, and pelvic cul-de-sac. It was necessary to proceed with endovaginal exam following the transabdominal exam to visualize the endometrial stripe and ovaries. Color and duplex Doppler ultrasound was utilized to evaluate blood flow to the ovaries. COMPARISON:  None. FINDINGS: Uterus Measurements: 8.5 x 4.0 x 4.5 cm = volume: 80 mL. No fibroids or other mass visualized. Endometrium Thickness: 5 mm.  No focal abnormality visualized. Right ovary Measurements: 2.5 x 1.7 x 1.5 cm = volume: 3.4 mL. Normal appearance/no adnexal mass. Left ovary Measurements: Surgically absent.  No adnexal mass identified. Pulsed Doppler evaluation of the right ovary demonstrates normal low-resistance arterial and venous waveforms. Other findings No abnormal free fluid. IMPRESSION: Normal appearance of uterus and right ovary. Previous left oophorectomy. No pelvic mass or other significant abnormality identified. No sonographic evidence for right ovarian torsion. Electronically Signed   By: Earle Gell M.D.   On: 10/04/2018 13:20   US Pelvis Complete  Result Date: 10/04/2018 CLINICAL DATA:  Left lower quadrant pain. Bilateral adnexal tenderness on exam. Endometriosis. Previous left oophorectomy. EXAM: TRANSABDOMINAL AND TRANSVAGINAL ULTRASOUND OF PELVIS DOPPLER ULTRASOUND OF OVARIES TECHNIQUE: Both  transabdominal and transvaginal ultrasound examinations of the pelvis were performed. Transabdominal technique was performed for global imaging of the pelvis including uterus, ovaries, adnexal regions, and pelvic cul-de-sac. It was necessary to proceed with endovaginal exam following the transabdominal exam to visualize the endometrial stripe and ovaries. Color and duplex Doppler ultrasound was utilized to evaluate blood flow to the ovaries. COMPARISON:  None. FINDINGS: Uterus Measurements: 8.5 x 4.0 x 4.5 cm = volume: 80 mL. No fibroids or other mass visualized. Endometrium Thickness: 5 mm.  No focal abnormality visualized. Right ovary Measurements: 2.5 x 1.7 x 1.5 cm = volume: 3.4 mL. Normal appearance/no adnexal mass. Left ovary Measurements: Surgically absent.  No adnexal mass identified. Pulsed Doppler evaluation of the right ovary demonstrates normal low-resistance arterial and venous waveforms. Other findings No abnormal free fluid. IMPRESSION: Normal appearance of uterus and right ovary. Previous left oophorectomy. No pelvic mass or other significant abnormality identified. No sonographic evidence for right ovarian torsion. Electronically Signed   By: Earle Gell M.D.   On: 10/04/2018 13:20   [place] Art/ven Flow Abd Pelv Doppler  Result Date: 10/04/2018 CLINICAL DATA:  Left lower quadrant pain. Bilateral adnexal tenderness on exam. Endometriosis. Previous left oophorectomy. EXAM: TRANSABDOMINAL AND TRANSVAGINAL ULTRASOUND OF PELVIS DOPPLER ULTRASOUND OF OVARIES TECHNIQUE: Both transabdominal and transvaginal ultrasound examinations of the pelvis were performed. Transabdominal technique was performed for global imaging of the pelvis including uterus, ovaries, adnexal regions, and pelvic cul-de-sac. It was necessary to proceed with endovaginal exam following the transabdominal exam to visualize the endometrial stripe and ovaries. Color and duplex Doppler ultrasound was utilized  to evaluate blood flow to the  ovaries. COMPARISON:  None. FINDINGS: Uterus Measurements: 8.5 x 4.0 x 4.5 cm = volume: 80 mL. No fibroids or other mass visualized. Endometrium Thickness: 5 mm.  No focal abnormality visualized. Right ovary Measurements: 2.5 x 1.7 x 1.5 cm = volume: 3.4 mL. Normal appearance/no adnexal mass. Left ovary Measurements: Surgically absent.  No adnexal mass identified. Pulsed Doppler evaluation of the right ovary demonstrates normal low-resistance arterial and venous waveforms. Other findings No abnormal free fluid. IMPRESSION: Normal appearance of uterus and right ovary. Previous left oophorectomy. No pelvic mass or other significant abnormality identified. No sonographic evidence for right ovarian torsion. Electronically Signed   By: Earle Gell M.D.   On: 10/04/2018 13:20    Procedures Procedures (including critical care time)  Medications Ordered in ED Medications  sodium chloride 0.9 % bolus 500 mL (0 mLs Intravenous Stopped 10/04/18 1108)  metoCLOPramide (REGLAN) injection 10 mg (10 mg Intravenous Given 10/04/18 1008)  cefTRIAXone (ROCEPHIN) 1 g in sodium chloride 0.9 % 100 mL IVPB (0 g Intravenous Stopped 10/04/18 1219)     Initial Impression / Assessment and Plan / ED Course  I have reviewed the triage vital signs and the nursing notes.  Pertinent labs & imaging results that were available during my care of the patient were reviewed by me and considered in my medical decision making (see chart for details).     Patient presenting with multiple complaints.  Patient appears to have urinary tract infection.  Labs are unremarkable.  EKG shows NSR.  Chest x-ray is clear.  Regarding chest pain, I feel this is chest wall pain as it is only present on palpation.  Regarding headache, her headache resolved completely with fluids and Reglan.  Will refer to neurology for evaluation of acute on chronic headaches.  Normal neuro exam without focal deficits.  Patient's pelvic exam showed tenderness and  considering history of ovarian masses and cysts, pelvic ultrasound was completed which was normal.  Wet prep shows yeast and clue cells which will be treated with Diflucan and Flagyl, respectively.  GC/chlamydia sent and pending.  UTI will be treated with Keflex initially.  Urine culture sent.  Return precautions discussed.  Patient advised to follow-up and establish care with a PCP as well.  Patient understands and agrees with plan.  Patient vitals stable throughout ED course and discharged in satisfactory condition.  Final Clinical Impressions(s) / ED Diagnoses   Final diagnoses:  Acute cystitis with hematuria  Vaginal yeast infection  BV (bacterial vaginosis)  Bad headache    ED Discharge Orders         Ordered    cephALEXin (KEFLEX) 500 MG capsule  2 times daily     10/04/18 1348    metroNIDAZOLE (FLAGYL) 500 MG tablet  2 times daily     10/04/18 1348    fluconazole (DIFLUCAN) 150 MG tablet  Daily     10/04/18 [address] [place] 10/04/18 1532    Sherwood Gambler, MD 10/04/18 [phone]

## 2018-10-05 LAB — URINE CULTURE: Culture: 60000 — AB

## 2018-10-05 LAB — GC/CHLAMYDIA PROBE AMP (~~LOC~~) NOT AT ARMC
Chlamydia: NEGATIVE
Neisseria Gonorrhea: NEGATIVE

## 2018-10-19 ENCOUNTER — Other Ambulatory Visit: Payer: Self-pay

## 2018-10-19 ENCOUNTER — Emergency Department (HOSPITAL_COMMUNITY)
Admission: EM | Admit: 2018-10-19 | Discharge: 2018-10-19 | Disposition: A | Discharge status: Home / Self Care | Payer: Medicaid Other | Attending: Emergency Medicine | Admitting: Emergency Medicine

## 2018-10-19 ENCOUNTER — Encounter (HOSPITAL_COMMUNITY): Payer: Self-pay

## 2018-10-19 DIAGNOSIS — R197 Diarrhea, unspecified: Secondary | ICD-10-CM | POA: Insufficient documentation

## 2018-10-19 DIAGNOSIS — R112 Nausea with vomiting, unspecified: Secondary | ICD-10-CM

## 2018-10-19 DIAGNOSIS — Z79899 Other long term (current) drug therapy: Secondary | ICD-10-CM | POA: Insufficient documentation

## 2018-10-19 LAB — COMPREHENSIVE METABOLIC PANEL
ALT: 13 U/L (ref 0–44)
ANION GAP: 6 (ref 5–15)
AST: 16 U/L (ref 15–41)
Albumin: 4 g/dL (ref 3.5–5.0)
Alkaline Phosphatase: 53 U/L (ref 38–126)
BUN: 12 mg/dL (ref 6–20)
CALCIUM: 8.9 mg/dL (ref 8.9–10.3)
CO2: 25 mmol/L (ref 22–32)
Chloride: 105 mmol/L (ref 98–111)
Creatinine, Ser: 0.86 mg/dL (ref 0.44–1.00)
GFR calc Af Amer: >60 mL/min (ref >60)
GFR calc non Af Amer: >60 mL/min (ref >60)
Glucose, Bld: 98 mg/dL (ref 70–99)
Potassium: 3.9 mmol/L (ref 3.5–5.1)
Sodium: 136 mmol/L (ref 135–145)
Total Bilirubin: 1.2 mg/dL (ref 0.3–1.2)
Total Protein: 7.5 g/dL (ref 6.5–8.1)

## 2018-10-19 LAB — CBC
HCT: 38.1 % (ref 36.0–46.0)
Hemoglobin: 12.6 g/dL (ref 12.0–15.0)
MCH: 27.9 pg (ref 26.0–34.0)
MCHC: 33.1 g/dL (ref 30.0–36.0)
MCV: 84.3 fL (ref 80.0–100.0)
PLATELETS: 235 10*3/uL (ref 150–400)
RBC: 4.52 MIL/uL (ref 3.87–5.11)
RDW: 14 % (ref 11.5–15.5)
WBC: 5.4 10*3/uL (ref 4.0–10.5)
nRBC: 0 % (ref 0.0–0.2)

## 2018-10-19 LAB — LIPASE, BLOOD: Lipase: 28 U/L (ref 11–51)

## 2018-10-19 LAB — I-STAT BETA HCG BLOOD, ED (MC, WL, AP ONLY)

## 2018-10-19 MED ORDER — SODIUM CHLORIDE 0.9 % IV BOLUS
1000.0000 mL | Freq: Once | INTRAVENOUS | Status: AC
  Administered 2018-10-19: 1000 mL via INTRAVENOUS

## 2018-10-19 MED ORDER — ONDANSETRON HCL 4 MG PO TABS: 4.0000 mg | ORAL_TABLET | Freq: Four times a day (QID) | ORAL | 0 refills | Status: DC

## 2018-10-19 MED ORDER — ONDANSETRON HCL 4 MG/2ML IJ SOLN
4.0000 mg | Freq: Once | INTRAMUSCULAR | Status: AC
  Administered 2018-10-19: 4 mg via INTRAVENOUS
  Filled 2018-10-19: qty 2

## 2018-10-19 NOTE — ED Notes (Signed)
ED Provider at bedside. 

## 2018-10-19 NOTE — ED Triage Notes (Signed)
Patient c/o N/v/D, abdominal pain and low back pain x 3 days. Patient states she started a new job at an [employer].

## 2018-10-19 NOTE — ED Provider Notes (Signed)
[place] DEPT Provider Note   CSN: [id number] Arrival date & time: 10/19/18  0759     History   Chief Complaint Chief Complaint  Patient presents with  . Emesis  . Diarrhea  . Back Pain  . Abdominal Pain    HPI Barbara Haas is a 28 y.o. female.  28 year old female with prior medical history as detailed below presents for evaluation of nausea, vomiting, and diarrhea.  Symptoms started 3 days ago.  She denies associated fever.  She does report multiple sick contacts with similar symptoms.  She denies abdominal pain.  She is able to tolerate clear fluids and saltine crackers at home.  She has not taken anything specifically for her nausea or other symptoms.  She denies bloody emesis or blood in her stool.  She denies chance of pregnancy.  She denies abdominal pain.  She denies urinary symptoms.  The history is provided by the patient and medical records.  Emesis   This is a new problem. The current episode started more than 2 days ago. The problem occurs 2 to 4 times per day. The problem has not changed since onset.The emesis has an appearance of stomach contents. There has been no fever.    Past Medical History:  Diagnosis Date  . Anxiety   . Asthma   . Back pain affecting pregnancy 03/11/2017  . Complication of anesthesia    bp drops  . Endometriosis   . Headache 03/11/2017  . Nausea and vomiting during pregnancy prior to [redacted] weeks gestation 03/11/2017  . Neck pain 03/11/2017  . Sickle cell trait [facility])     Patient Active Problem List   Diagnosis Date Noted  . Indication for care in labor or delivery 10/09/2017  . Spontaneous vaginal delivery 10/09/2017  . Nausea and vomiting during pregnancy prior to [redacted] weeks gestation 03/11/2017  . Back pain affecting pregnancy 03/11/2017  . Headache 03/11/2017  . Neck pain 03/11/2017  . SVD (spontaneous vaginal delivery) 12/22/2015  . Postpartum care following vaginal delivery 12/22/2015  .  Indication for care in labor and delivery, antepartum 12/21/2015    Past Surgical History:  Procedure Laterality Date  . OOPHORECTOMY     left  . OVARIAN CYST REMOVAL    . WISDOM TOOTH EXTRACTION       OB History    Gravida  3   Para  2   Term  2   Preterm      AB  1   Living  2     SAB  1   TAB      Ectopic      Multiple  0   Live Births  2            Home Medications    Prior to Admission medications   Medication Sig Start Date End Date Taking? Authorizing Provider  albuterol (PROVENTIL HFA;VENTOLIN HFA) 108 (90 Base) MCG/ACT inhaler Inhale 2 puffs into the lungs every 4 (four) hours as needed for wheezing or shortness of breath.     [provider]  cephALEXin (KEFLEX) 500 MG capsule Take 1 capsule (500 mg total) by mouth 2 (two) times daily. 10/04/18   Frederica Kuster, PA-C  fluticasone-salmeterol (ADVAIR HFA) 947-65 MCG/ACT inhaler Inhale 2 puffs into the lungs 2 (two) times daily as needed (for shortness of breath).     [provider]  ibuprofen (ADVIL,MOTRIN) 200 MG tablet Take 800 mg by mouth every 6 (six) hours as  needed for headache or moderate pain.    [provider]  Levonorgestrel-Ethinyl Estradiol (AMETHIA,CAMRESE) 0.15-0.03 &0.01 MG tablet Take 1 tablet by mouth daily. 12/08/17   [provider]  metroNIDAZOLE (FLAGYL) 500 MG tablet Take 1 tablet (500 mg total) by mouth 2 (two) times daily. 10/04/18   Law, Bea Graff, PA-C  naproxen sodium (ALEVE) 220 MG tablet Take 880 mg by mouth daily as needed (for pain or headache).    [provider]  Prenatal Vit-Fe Fumarate-FA (MULTIVITAMIN-PRENATAL) 27-0.8 MG TABS tablet Take 1 tablet by mouth daily.     [provider]    Family History Family History  Adopted: Yes  Family history unknown: Yes    Social History Social History   Tobacco Use  . Smoking status: Never Smoker  . Smokeless tobacco: Never Used  Substance Use Topics  . Alcohol  use: Yes  . Drug use: No     Allergies   Shellfish allergy; Codeine; Contrast media [iodinated diagnostic agents]; and Other   Review of Systems Review of Systems  All other systems reviewed and are negative.    Physical Exam Updated Vital Signs BP 129/85 (BP Location: Right Arm)   Pulse 94   Temp 98.1 F (36.7 C) (Oral)   Resp 18   Ht 5' 9.5" (1.765 m)   Wt 117 kg   SpO2 100%   BMI 37.55 kg/m   Physical Exam Vitals signs and nursing note reviewed.  Constitutional:      General: She is not in acute distress.    Appearance: She is well-developed.  HENT:     Head: Normocephalic and atraumatic.  Eyes:     Conjunctiva/sclera: Conjunctivae normal.     Pupils: Pupils are equal, round, and reactive to light.  Neck:     Musculoskeletal: Normal range of motion and neck supple.  Cardiovascular:     Rate and Rhythm: Normal rate and regular rhythm.     Heart sounds: Normal heart sounds.  Pulmonary:     Effort: Pulmonary effort is normal. No respiratory distress.     Breath sounds: Normal breath sounds.  Abdominal:     General: Bowel sounds are normal. There is no distension.     Palpations: Abdomen is soft.     Tenderness: There is no abdominal tenderness.  Musculoskeletal: Normal range of motion.        General: No deformity.  Skin:    General: Skin is warm and dry.  Neurological:     Mental Status: She is alert and oriented to person, place, and time.      ED Treatments / Results  Labs (all labs ordered are listed, but only abnormal results are displayed) Labs Reviewed  LIPASE, BLOOD  COMPREHENSIVE METABOLIC PANEL  CBC  URINALYSIS, ROUTINE W REFLEX MICROSCOPIC  I-STAT BETA HCG BLOOD, ED ([facility], [facility], [facility] ONLY)    EKG None  Radiology No results found.  Procedures Procedures (including critical care time)  Medications Ordered in ED Medications  sodium chloride 0.9 % bolus 1,000 mL (1,000 mLs Intravenous New Bag/Given (Non-Interop) 10/19/18 5456)    ondansetron (ZOFRAN) injection 4 mg (4 mg Intravenous Given 10/19/18 2563)     Initial Impression / Assessment and Plan / ED Course  I have reviewed the triage vital signs and the nursing notes.  Pertinent labs & imaging results that were available during my care of the patient were reviewed by me and considered in my medical decision making (see chart for details).  MDM  Screen complete  Patient is presenting for evaluation of nausea, vomiting, diarrhea.  Symptoms are most likely secondary to a viral syndrome.  Screening labs obtained in the ED are without significant abnormality.  She does feel significantly improved following antiemetics and IV fluids.    She understands the need for close follow-up.  Strict return precautions given and understood.  Final Clinical Impressions(s) / ED Diagnoses   Final diagnoses:  Nausea vomiting and diarrhea    ED Discharge Orders         Ordered    ondansetron (ZOFRAN) 4 MG tablet  Every 6 hours     10/19/18 0946           Valarie Merino, MD 10/19/18 [phone]

## 2018-10-19 NOTE — Discharge Instructions (Signed)
Please return for any problem.   Follow up with your regular care providers as instructed.  

## 2018-12-04 ENCOUNTER — Ambulatory Visit: Payer: Medicaid Other | Admitting: Neurology

## 2018-12-21 ENCOUNTER — Ambulatory Visit: Payer: Medicaid Other | Admitting: Neurology

## 2018-12-21 ENCOUNTER — Encounter: Payer: Self-pay | Admitting: Neurology

## 2018-12-21 ENCOUNTER — Other Ambulatory Visit: Payer: Self-pay

## 2018-12-21 DIAGNOSIS — G43019 Migraine without aura, intractable, without status migrainosus: Secondary | ICD-10-CM | POA: Diagnosis not present

## 2018-12-21 HISTORY — DX: Migraine without aura, intractable, without status migrainosus: G43.019

## 2018-12-21 MED ORDER — PROPRANOLOL HCL 20 MG PO TABS: ORAL_TABLET | ORAL | 3 refills | Status: DC

## 2018-12-21 MED ORDER — RIZATRIPTAN BENZOATE 10 MG PO TABS: 10.0000 mg | ORAL_TABLET | Freq: Three times a day (TID) | ORAL | 3 refills | Status: DC | PRN

## 2018-12-21 NOTE — Progress Notes (Signed)
Reason for visit: Migraine headache  Referring physician: [place]  Barbara Haas is a 28 y.o. female  History of present illness:  Barbara Haas is a 28 year old right-handed black female with a history of migraine headaches since she was in high school, at least 10 years.  The patient claims that as a child she bumped the back of her head and had to have stitches, but her headaches did not start until she got into high school.  The patient indicates that her headaches have become much more frequent over the last couple months, she is now having at times daily headaches for up to 2 weeks, she is having at least 12 headache days a month.  The headaches tend to start in the back of the head, particularly on the right and spread to the right temporal area.  She may have photophobia and phonophobia with the headache, some nausea without vomiting is also noted.  When the headaches are severe, she may have black spots in front of the eyes.  She notes that odors such as strong perfumes or chemical odors may bring on a headache, occasionally bright lights may also do the same thing.  She may have some neck stiffness.  She denies any family history of migraine, but she is adopted and she has limited knowledge about her parents.  The patient reports no numbness in the arms or legs, she occasionally may have some tingling in the hands.  She denies any weakness or any balance changes or difficulty controlling the bowels or the bladder.  She is sent to this office for an evaluation, she has never been on any medications to help prevent the headache.  She does occasionally miss work because of the headache.  Past Medical History:  Diagnosis Date  . Anxiety   . Asthma   . Back pain affecting pregnancy 03/11/2017  . Complication of anesthesia    bp drops  . Endometriosis   . Headache 03/11/2017  . Nausea and vomiting during pregnancy prior to [redacted] weeks gestation 03/11/2017  . Neck pain 03/11/2017  . Sickle  cell trait [facility])     Past Surgical History:  Procedure Laterality Date  . OOPHORECTOMY     left  . OVARIAN CYST REMOVAL    . WISDOM TOOTH EXTRACTION      Family History  Adopted: Yes  Family history unknown: Yes    Social history:  reports that she has never smoked. She has never used smokeless tobacco. She reports current alcohol use. She reports that she does not use drugs.  Medications:  Prior to Admission medications   Medication Sig Start Date End Date Taking? Authorizing Provider  albuterol (PROVENTIL HFA;VENTOLIN HFA) 108 (90 Base) MCG/ACT inhaler Inhale 2 puffs into the lungs every 4 (four) hours as needed for wheezing or shortness of breath.     [provider]  cephALEXin (KEFLEX) 500 MG capsule Take 1 capsule (500 mg total) by mouth 2 (two) times daily. Patient not taking: Reported on 10/19/2018 10/04/18   Frederica Kuster, PA-C  fluticasone-salmeterol (ADVAIR HFA) [phone] MCG/ACT inhaler Inhale 2 puffs into the lungs 2 (two) times daily as needed (for shortness of breath).     [provider]  ibuprofen (ADVIL,MOTRIN) 200 MG tablet Take 800 mg by mouth every 6 (six) hours as needed for headache or moderate pain.    [provider]  Levonorgestrel-Ethinyl Estradiol (AMETHIA,CAMRESE) 0.15-0.03 &0.01 MG tablet Take 1 tablet by mouth daily. 12/08/17  [provider]  metroNIDAZOLE (FLAGYL) 500 MG tablet Take 1 tablet (500 mg total) by mouth 2 (two) times daily. Patient not taking: Reported on 10/19/2018 10/04/18   Frederica Kuster, PA-C  naproxen sodium (ALEVE) 220 MG tablet Take 880 mg by mouth daily as needed (for pain or headache).    [provider]  ondansetron (ZOFRAN) 4 MG tablet Take 1 tablet (4 mg total) by mouth every 6 (six) hours. 10/19/18   Valarie Merino, MD      Allergies  Allergen Reactions  . Shellfish Allergy Anaphylaxis  . Codeine Other (See Comments)    Reaction:  Sore throat   . Contrast Media [Iodinated  Diagnostic Agents] Nausea And Vomiting  . Other Itching, Swelling and Other (See Comments)    Pt is allergic to [place].   Reaction:  Lip swelling     ROS:  Out of a complete 14 system review of symptoms, the patient complains only of the following symptoms, and all other reviewed systems are negative.  Weight gain Chest pain Eye discomfort Headache, decreased energy Insomnia  Blood pressure 135/86, pulse 82, height 5\' 9"  (1.753 m), weight 259 lb (117.5 kg), unknown if currently breastfeeding.  Physical Exam  General: The patient is alert and cooperative at the time of the examination.  The patient is markedly obese.  Eyes: Pupils are equal, round, and reactive to light. Discs are flat bilaterally.  Neck: The neck is supple, no carotid bruits are noted.  Respiratory: The respiratory examination is clear.  Cardiovascular: The cardiovascular examination reveals a regular rate and rhythm, no obvious murmurs or rubs are noted.  Neuromuscular: Range of movement the cervical spine is full, no crepitus is noted in the temporomandibular joints.  Skin: Extremities are without significant edema.  Neurologic Exam  Mental status: The patient is alert and oriented x 3 at the time of the examination. The patient has apparent normal recent and remote memory, with an apparently normal attention span and concentration ability.  Cranial nerves: Facial symmetry is present. There is good sensation of the face to pinprick and soft touch bilaterally. The strength of the facial muscles and the muscles to head turning and shoulder shrug are normal bilaterally. Speech is well enunciated, no aphasia or dysarthria is noted. Extraocular movements are full. Visual fields are full. The tongue is midline, and the patient has symmetric elevation of the soft palate. No obvious hearing deficits are noted.  Motor: The motor testing reveals 5 over 5 strength of all 4 extremities. Good symmetric motor tone is  noted throughout.  Sensory: Sensory testing is intact to pinprick, soft touch, vibration sensation, and position sense on all 4 extremities. No evidence of extinction is noted.  Coordination: Cerebellar testing reveals good finger-nose-finger and heel-to-shin bilaterally.  Gait and station: Gait is normal. Tandem gait is normal. Romberg is negative. No drift is seen.  Reflexes: Deep tendon reflexes are symmetric and normal bilaterally. Toes are downgoing bilaterally.   Assessment/Plan:  1.  Common migraine headache, intractable  The patient does not take in any caffeinated products during the day, she is on birth control pills but she does not relate the increased frequency of her headaches to this.  The patient will go on a daily preventative medication, will start with propranolol.  The patient will call for any dose adjustments, she will work up to 40 mg twice daily.  The patient was placed on Maxalt to take if needed, she can take this with the Aleve  or Advil.  She will follow-up here in 3 months.   Jill Alexanders MD 12/21/2018 7:29 AM  [facility] [address] [place], [place] [address]  Phone [phone] Fax [phone]

## 2018-12-21 NOTE — Patient Instructions (Signed)
We will start propranolol daily to prevent the headache, use maxalt if needed.    Inderal (propranolol) is a blood pressure medication that is commonly used for migraine headaches. This is a type of beta blocker. The most common side effects include low heart rate, dizziness, fatigue, and increased depression. This medication may worsen asthma. If you believe that you are having side effects on this medication, please contact our office.

## 2019-03-18 ENCOUNTER — Telehealth: Payer: Self-pay | Admitting: *Deleted

## 2019-03-18 NOTE — Telephone Encounter (Signed)
Due to current COVID 19 pandemic, our office is severely reducing in office visits until further notice, in order to minimize the risk to our patients and healthcare providers.  Pt understands that although there may be some limitations with this type of visit, we will take all precautions to reduce any security or privacy concerns.  Pt understands that this will be treated like an in office visit and we will file with pt's insurance, and there may be a patient responsible charge related to this service.  Pt consented to [email or web address] visit. Email sent to [email or web address].

## 2019-03-21 NOTE — Progress Notes (Signed)
This encounter was created in error - please disregard.

## 2019-03-22 ENCOUNTER — Other Ambulatory Visit: Payer: Self-pay

## 2019-03-22 ENCOUNTER — Telehealth: Payer: Self-pay | Admitting: Neurology

## 2019-03-22 ENCOUNTER — Encounter (INDEPENDENT_AMBULATORY_CARE_PROVIDER_SITE_OTHER): Payer: Medicaid Other | Admitting: Neurology

## 2019-03-22 DIAGNOSIS — Z0289 Encounter for other administrative examinations: Secondary | ICD-10-CM

## 2019-03-22 NOTE — Telephone Encounter (Signed)
I called the patient.  She had a video visit scheduled for June 8 at 215.  There was no answer, I left a voicemail asking her to call back to reschedule.

## 2019-03-23 ENCOUNTER — Ambulatory Visit: Payer: Medicaid Other | Admitting: Neurology

## 2019-09-10 ENCOUNTER — Other Ambulatory Visit: Payer: Self-pay

## 2019-09-10 ENCOUNTER — Emergency Department (HOSPITAL_COMMUNITY): Payer: Medicaid Other

## 2019-09-10 ENCOUNTER — Emergency Department (HOSPITAL_COMMUNITY)
Admission: EM | Admit: 2019-09-10 | Discharge: 2019-09-10 | Disposition: A | Discharge status: Home / Self Care | Payer: Medicaid Other | Attending: Emergency Medicine | Admitting: Emergency Medicine

## 2019-09-10 ENCOUNTER — Encounter (HOSPITAL_COMMUNITY): Payer: Self-pay

## 2019-09-10 DIAGNOSIS — R1013 Epigastric pain: Secondary | ICD-10-CM | POA: Insufficient documentation

## 2019-09-10 DIAGNOSIS — R1110 Vomiting, unspecified: Secondary | ICD-10-CM | POA: Insufficient documentation

## 2019-09-10 DIAGNOSIS — R1030 Lower abdominal pain, unspecified: Secondary | ICD-10-CM | POA: Insufficient documentation

## 2019-09-10 DIAGNOSIS — R0602 Shortness of breath: Secondary | ICD-10-CM | POA: Insufficient documentation

## 2019-09-10 DIAGNOSIS — Z79899 Other long term (current) drug therapy: Secondary | ICD-10-CM | POA: Diagnosis not present

## 2019-09-10 DIAGNOSIS — J45909 Unspecified asthma, uncomplicated: Secondary | ICD-10-CM | POA: Insufficient documentation

## 2019-09-10 DIAGNOSIS — R079 Chest pain, unspecified: Secondary | ICD-10-CM

## 2019-09-10 DIAGNOSIS — K529 Noninfective gastroenteritis and colitis, unspecified: Secondary | ICD-10-CM | POA: Insufficient documentation

## 2019-09-10 DIAGNOSIS — R197 Diarrhea, unspecified: Secondary | ICD-10-CM | POA: Diagnosis present

## 2019-09-10 DIAGNOSIS — Z20828 Contact with and (suspected) exposure to other viral communicable diseases: Secondary | ICD-10-CM | POA: Insufficient documentation

## 2019-09-10 LAB — COMPREHENSIVE METABOLIC PANEL
ALT: 13 U/L (ref 0–44)
AST: 19 U/L (ref 15–41)
Albumin: 3.6 g/dL (ref 3.5–5.0)
Alkaline Phosphatase: 58 U/L (ref 38–126)
Anion gap: 8 (ref 5–15)
BUN: 12 mg/dL (ref 6–20)
CO2: 25 mmol/L (ref 22–32)
Calcium: 8.7 mg/dL — ABNORMAL LOW (ref 8.9–10.3)
Chloride: 106 mmol/L (ref 98–111)
Creatinine, Ser: 0.98 mg/dL (ref 0.44–1.00)
GFR calc Af Amer: >60 mL/min (ref >60)
GFR calc non Af Amer: >60 mL/min (ref >60)
Glucose, Bld: 91 mg/dL (ref 70–99)
Potassium: 3.8 mmol/L (ref 3.5–5.1)
Sodium: 139 mmol/L (ref 135–145)
Total Bilirubin: 0.4 mg/dL (ref 0.3–1.2)
Total Protein: 7.4 g/dL (ref 6.5–8.1)

## 2019-09-10 LAB — LIPASE, BLOOD: Lipase: 25 U/L (ref 11–51)

## 2019-09-10 LAB — CBC
HCT: 35.5 % — ABNORMAL LOW (ref 36.0–46.0)
Hemoglobin: 11.5 g/dL — ABNORMAL LOW (ref 12.0–15.0)
MCH: 28 pg (ref 26.0–34.0)
MCHC: 32.4 g/dL (ref 30.0–36.0)
MCV: 86.6 fL (ref 80.0–100.0)
Platelets: 220 10*3/uL (ref 150–400)
RBC: 4.1 MIL/uL (ref 3.87–5.11)
RDW: 14 % (ref 11.5–15.5)
WBC: 7.7 10*3/uL (ref 4.0–10.5)
nRBC: 0 % (ref 0.0–0.2)

## 2019-09-10 LAB — TROPONIN I (HIGH SENSITIVITY)
Troponin I (High Sensitivity): <2 ng/L (ref <18)
Troponin I (High Sensitivity): <2 ng/L (ref <18)

## 2019-09-10 LAB — POC SARS CORONAVIRUS 2 AG -  ED: SARS Coronavirus 2 Ag: NEGATIVE

## 2019-09-10 LAB — I-STAT BETA HCG BLOOD, ED (MC, WL, AP ONLY): I-stat hCG, quantitative: <5 m[IU]/mL (ref <5)

## 2019-09-10 MED ORDER — ACETAMINOPHEN 325 MG PO TABS
650.0000 mg | ORAL_TABLET | Freq: Once | ORAL | Status: AC
  Administered 2019-09-10: 17:00:00 650 mg via ORAL
  Filled 2019-09-10: qty 2

## 2019-09-10 MED ORDER — ONDANSETRON 4 MG PO TBDP: 4.0000 mg | ORAL_TABLET | Freq: Three times a day (TID) | ORAL | 0 refills | Status: DC | PRN

## 2019-09-10 MED ORDER — SODIUM CHLORIDE 0.9% FLUSH
3.0000 mL | Freq: Once | INTRAVENOUS | Status: AC
  Administered 2019-09-10: 18:00:00 3 mL via INTRAVENOUS

## 2019-09-10 MED ORDER — SODIUM CHLORIDE 0.9 % IV BOLUS
1000.0000 mL | Freq: Once | INTRAVENOUS | Status: AC
  Administered 2019-09-10: 17:00:00 1000 mL via INTRAVENOUS

## 2019-09-10 MED ORDER — ONDANSETRON HCL 4 MG/2ML IJ SOLN
4.0000 mg | Freq: Once | INTRAMUSCULAR | Status: AC
  Administered 2019-09-10: 17:00:00 4 mg via INTRAVENOUS
  Filled 2019-09-10: qty 2

## 2019-09-10 NOTE — Discharge Instructions (Addendum)
If you develop worsening, continued, or recurrent abdominal pain, uncontrolled vomiting, fever, chest or back pain, or any other new/concerning symptoms then return to the ER for evaluation.   You are being tested for the novel coronavirus.  Since this test is not back yet you need to quarantine as if you have it.

## 2019-09-10 NOTE — ED Triage Notes (Signed)
Pt presents with c/o diarrhea, emesis, and chest pain. 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 Pt reports she has been unable to keep her food down. Pt reports a small cough and some mild shortness of breath. Pt denies any fever but reports some chills.

## 2019-09-10 NOTE — ED Provider Notes (Signed)
[facility] DEPT Provider Note   CSN: [id number] Arrival date & time: 09/10/19  1621     History   Chief Complaint Chief Complaint  Patient presents with   Emesis   Chest Pain   Diarrhea    HPI Barbara Haas is a 28 y.o. female.     HPI  28 year old female presents with diarrhea and vomiting.  Has been overall feeling poorly for about 8 days.  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  Since it started she has been having some nonspecific chest pressure in her lower chest.  Comes and goes.  Often when she puts on her N95 she notices it more.  Some mild shortness of breath but has asthma and has not thought too much about the shortness of breath.  No cough.  No fevers.  She has been having nausea but over the last couple days has turned into a couple episodes of vomiting.  Has also been having numerous episodes of diarrhea without blood.  Over the last couple days has also had some lower abdominal and epigastric abdominal pain.  Past Medical History:  Diagnosis Date   Anxiety    Asthma    Back pain affecting pregnancy 03/11/2017   Common migraine with intractable migraine [id number]   Complication of anesthesia    bp drops   Endometriosis    Headache 03/11/2017   Nausea and vomiting during pregnancy prior to [redacted] weeks gestation 03/11/2017   Neck pain 03/11/2017   Sickle cell trait [facility])     Patient Active Problem List   Diagnosis Date Noted   Common migraine with intractable migraine 12/21/2018   Indication for care in labor or delivery 10/09/2017   Spontaneous vaginal delivery 10/09/2017   Nausea and vomiting during pregnancy prior to [redacted] weeks gestation 03/11/2017   Back pain affecting pregnancy 03/11/2017   Headache 03/11/2017   Neck pain 03/11/2017   SVD (spontaneous vaginal delivery) 12/22/2015   Postpartum care following vaginal delivery 12/22/2015   Indication for care in labor  and delivery, antepartum 12/21/2015    Past Surgical History:  Procedure Laterality Date   OOPHORECTOMY     left   OVARIAN CYST REMOVAL     WISDOM TOOTH EXTRACTION       OB History    Gravida  3   Para  2   Term  2   Preterm      AB  1   Living  2     SAB  1   TAB      Ectopic      Multiple  0   Live Births  2            Home Medications    Prior to Admission medications   Medication Sig Start Date End Date Taking? Authorizing Provider  albuterol (PROVENTIL HFA;VENTOLIN HFA) 108 (90 Base) MCG/ACT inhaler Inhale 2 puffs into the lungs every 4 (four) hours as needed for wheezing or shortness of breath.    Yes [provider]  fluticasone-salmeterol (ADVAIR HFA) 115-21 MCG/ACT inhaler Inhale 2 puffs into the lungs 2 (two) times daily as needed (for shortness of breath).    Yes [provider]  ibuprofen (ADVIL,MOTRIN) 200 MG tablet Take 800 mg by mouth 2 (two) times daily as needed for headache or moderate pain.    Yes [provider]  Levonorgestrel-Ethinyl Estradiol (AMETHIA,CAMRESE) 0.15-0.03 &0.01 MG tablet Take 1 tablet by mouth daily. 12/08/17  Yes [provider]  naproxen sodium (ALEVE) 220 MG tablet Take 880 mg by mouth daily as needed (for pain or headache).   Yes [provider]  ondansetron (ZOFRAN ODT) 4 MG disintegrating tablet Take 1 tablet (4 mg total) by mouth every 8 (eight) hours as needed for nausea or vomiting. 09/10/19   Sherwood Gambler, MD  ondansetron (ZOFRAN) 4 MG tablet Take 1 tablet (4 mg total) by mouth every 6 (six) hours. 10/19/18   Valarie Merino, MD  propranolol (INDERAL) 20 MG tablet One tablet twice a day for 2 weeks, then take 2 twice a day 12/21/18   Kathrynn Ducking, MD  rizatriptan (MAXALT) 10 MG tablet Take 1 tablet (10 mg total) by mouth 3 (three) times daily as needed for migraine. 12/21/18   Kathrynn Ducking, MD    Family History Family History  Adopted: Yes  Problem Relation  Age of Onset   COPD Mother    Hypertension Father     Social History Social History   Tobacco Use   Smoking status: Never Smoker   Smokeless tobacco: Never Used  Substance Use Topics   Alcohol use: Yes   Drug use: No     Allergies   Shellfish allergy, Codeine, Contrast media [iodinated diagnostic agents], and Other   Review of Systems Review of Systems  Constitutional: Negative for fever.  Respiratory: Positive for shortness of breath. Negative for cough.   Cardiovascular: Positive for chest pain.  Gastrointestinal: Positive for abdominal pain, diarrhea, nausea and vomiting. Negative for blood in stool.  All other systems reviewed and are negative.    Physical Exam Updated Vital Signs BP 122/78    Pulse 77    Temp 100.2 F (37.9 C) (Oral)    Resp 20    Ht 5' 9.5" (1.765 m)    Wt 116.6 kg    LMP 08/14/2019 (Approximate)    SpO2 99%    BMI 37.41 kg/m   Physical Exam Vitals signs and nursing note reviewed.  Constitutional:      Appearance: She is well-developed.  HENT:     Head: Normocephalic and atraumatic.     Right Ear: External ear normal.     Left Ear: External ear normal.     Nose: Nose normal.  Eyes:     General:        Right eye: No discharge.        Left eye: No discharge.  Cardiovascular:     Rate and Rhythm: Normal rate and regular rhythm.     Heart sounds: Normal heart sounds.  Pulmonary:     Effort: Pulmonary effort is normal.     Breath sounds: Normal breath sounds.  Abdominal:     Palpations: Abdomen is soft.     Tenderness: There is abdominal tenderness in the right lower quadrant and left lower quadrant.     Comments: Mild lower abdominal tenderness, non focal  Skin:    General: Skin is warm and dry.  Neurological:     Mental Status: She is alert.  Psychiatric:        Mood and Affect: Mood is not anxious.      ED Treatments / Results  Labs (all labs ordered are listed, but only abnormal results are displayed) Labs Reviewed    CBC - Abnormal; Notable for the following components:      Result Value   Hemoglobin 11.5 (*)    HCT 35.5 (*)    All other components  within normal limits  COMPREHENSIVE METABOLIC PANEL - Abnormal; Notable for the following components:   Calcium 8.7 (*)    All other components within normal limits  SARS CORONAVIRUS 2 (TAT 6-24 HRS)  LIPASE, BLOOD  I-STAT BETA HCG BLOOD, ED ([facility], [facility], [facility] ONLY)  POC SARS CORONAVIRUS 2 AG -  ED  TROPONIN I (HIGH SENSITIVITY)  TROPONIN I (HIGH SENSITIVITY)    EKG EKG Interpretation  Date/Time:  Friday September 10 2019 16:37:04 EST Ventricular Rate:  82 PR Interval:    QRS Duration: 83 QT Interval:  361 QTC Calculation: 422 R Axis:   64 Text Interpretation: Sinus rhythm no acute ST/T changes no significant change since Dec 2019 Confirmed by Sherwood Gambler [phone]) on 09/10/2019 4:45:49 PM   Radiology Ct Abdomen Pelvis Wo Contrast  Result Date: 09/10/2019 CLINICAL DATA:  Abdominal pain with diverticulitis suspected. EXAM: CT ABDOMEN AND PELVIS WITHOUT CONTRAST TECHNIQUE: Multidetector CT imaging of the abdomen and pelvis was performed following the standard protocol without IV contrast. COMPARISON:  None. FINDINGS: Lower chest: The lung bases are clear. The heart size is normal. Hepatobiliary: The liver is normal. Normal gallbladder.There is no biliary ductal dilation. Pancreas: Normal contours without ductal dilatation. No peripancreatic fluid collection. Spleen: No splenic laceration or hematoma. Adrenals/Urinary Tract: --Adrenal glands: No adrenal hemorrhage. --Right kidney/ureter: No hydronephrosis or perinephric hematoma. --Left kidney/ureter: No hydronephrosis or perinephric hematoma. --Urinary bladder: Unremarkable. Stomach/Bowel: --Stomach/Duodenum: No hiatal hernia or other gastric abnormality. Normal duodenal course and caliber. --Small bowel: No dilatation or inflammation. --Colon: No focal abnormality. --Appendix: Normal. Vascular/Lymphatic:  Normal course and caliber of the major abdominal vessels. --No retroperitoneal lymphadenopathy. --No mesenteric lymphadenopathy. --No pelvic or inguinal lymphadenopathy. Reproductive: Unremarkable Other: No ascites or free air. The abdominal wall is normal. Musculoskeletal. No acute displaced fractures. IMPRESSION: No acute abdominopelvic abnormality. Electronically Signed   By: Constance Holster M.D.   On: 09/10/2019 18:36   Dg Chest Port 1 View  Result Date: 09/10/2019 CLINICAL DATA:  Diarrhea and emesis with chest pain. EXAM: PORTABLE CHEST 1 VIEW COMPARISON:  10/04/2018 FINDINGS: The heart size and mediastinal contours are within normal limits. Both lungs are clear. The visualized skeletal structures are unremarkable. IMPRESSION: No active disease. Electronically Signed   By: Constance Holster M.D.   On: 09/10/2019 17:26    Procedures Procedures (including critical care time)  Medications Ordered in ED Medications  sodium chloride flush (NS) 0.9 % injection 3 mL (3 mLs Intravenous Given 09/10/19 1730)  sodium chloride 0.9 % bolus 1,000 mL (0 mLs Intravenous Stopped 09/10/19 1934)  acetaminophen (TYLENOL) tablet 650 mg (650 mg Oral Given 09/10/19 1729)  ondansetron (ZOFRAN) injection 4 mg (4 mg Intravenous Given 09/10/19 1729)     Initial Impression / Assessment and Plan / ED Course  I have reviewed the triage vital signs and the nursing notes.  Pertinent labs & imaging results that were available during my care of the patient were reviewed by me and considered in my medical decision making (see chart for details).        Patient's coronavirus antigen testing is negative.  Will retest with the PCR.  Otherwise, could be other viral illness as well.  The patient has pretty nonspecific chest pain on and off for a week and my suspicion of ACS, PE, dissection, myocarditis, etc. is pretty low.  Troponin is negative.  After this long I do not think she needs a second.  ECG without acute  ischemic changes.  Labs overall reassuring.  Given the tenderness, CT was obtained and is negative.  We discussed quarantining until the coronavirus test comes back but otherwise she appears stable for discharge home with supportive care.  Lakeitha Hobbs Gentile was evaluated in Emergency Department on 09/10/2019 for the symptoms described in the history of present illness. She was evaluated in the context of the global COVID-19 pandemic, which necessitated consideration that the patient might be at risk for infection with the SARS-CoV-2 virus that causes COVID-19. Institutional protocols and algorithms that pertain to the evaluation of patients at risk for COVID-19 are in a state of rapid change based on information released by regulatory bodies including the CDC and federal and state organizations. These policies and algorithms were followed during the patient's care in the ED.   Final Clinical Impressions(s) / ED Diagnoses   Final diagnoses:  Gastroenteritis    ED Discharge Orders         Ordered    ondansetron (ZOFRAN ODT) 4 MG disintegrating tablet  Every 8 hours PRN     09/10/19 1904           Sherwood Gambler, MD 09/10/19 1940

## 2019-09-11 LAB — SARS CORONAVIRUS 2 (TAT 6-24 HRS): SARS Coronavirus 2: NEGATIVE

## 2019-09-23 ENCOUNTER — Encounter (HOSPITAL_COMMUNITY): Payer: Self-pay

## 2019-09-23 ENCOUNTER — Emergency Department (HOSPITAL_COMMUNITY): Payer: Medicaid Other

## 2019-09-23 ENCOUNTER — Emergency Department (HOSPITAL_COMMUNITY)
Admission: EM | Admit: 2019-09-23 | Discharge: 2019-09-23 | Disposition: A | Discharge status: Home / Self Care | Payer: Medicaid Other | Attending: Emergency Medicine | Admitting: Emergency Medicine

## 2019-09-23 ENCOUNTER — Other Ambulatory Visit: Payer: Self-pay

## 2019-09-23 DIAGNOSIS — J4521 Mild intermittent asthma with (acute) exacerbation: Secondary | ICD-10-CM | POA: Diagnosis not present

## 2019-09-23 DIAGNOSIS — M545 Low back pain: Secondary | ICD-10-CM | POA: Insufficient documentation

## 2019-09-23 DIAGNOSIS — Z20828 Contact with and (suspected) exposure to other viral communicable diseases: Secondary | ICD-10-CM | POA: Insufficient documentation

## 2019-09-23 LAB — CBC WITH DIFFERENTIAL/PLATELET
Abs Immature Granulocytes: 0.02 10*3/uL (ref 0.00–0.07)
Basophils Absolute: 0.1 10*3/uL (ref 0.0–0.1)
Basophils Relative: 1 %
Eosinophils Absolute: 0.1 10*3/uL (ref 0.0–0.5)
Eosinophils Relative: 2 %
HCT: 39.8 % (ref 36.0–46.0)
Hemoglobin: 12.8 g/dL (ref 12.0–15.0)
Immature Granulocytes: 0 %
Lymphocytes Relative: 35 %
Lymphs Abs: 2.2 10*3/uL (ref 0.7–4.0)
MCH: 27.8 pg (ref 26.0–34.0)
MCHC: 32.2 g/dL (ref 30.0–36.0)
MCV: 86.5 fL (ref 80.0–100.0)
Monocytes Absolute: 0.4 10*3/uL (ref 0.1–1.0)
Monocytes Relative: 7 %
Neutro Abs: 3.4 10*3/uL (ref 1.7–7.7)
Neutrophils Relative %: 55 %
Platelets: 268 10*3/uL (ref 150–400)
RBC: 4.6 MIL/uL (ref 3.87–5.11)
RDW: 14 % (ref 11.5–15.5)
WBC: 6.2 10*3/uL (ref 4.0–10.5)
nRBC: 0 % (ref 0.0–0.2)

## 2019-09-23 LAB — BASIC METABOLIC PANEL
Anion gap: 8 (ref 5–15)
BUN: 11 mg/dL (ref 6–20)
CO2: 28 mmol/L (ref 22–32)
Calcium: 9.3 mg/dL (ref 8.9–10.3)
Chloride: 102 mmol/L (ref 98–111)
Creatinine, Ser: 0.81 mg/dL (ref 0.44–1.00)
GFR calc Af Amer: >60 mL/min (ref >60)
GFR calc non Af Amer: >60 mL/min (ref >60)
Glucose, Bld: 90 mg/dL (ref 70–99)
Potassium: 3.9 mmol/L (ref 3.5–5.1)
Sodium: 138 mmol/L (ref 135–145)

## 2019-09-23 LAB — INFLUENZA PANEL BY PCR (TYPE A & B)
Influenza A By PCR: NEGATIVE
Influenza B By PCR: NEGATIVE

## 2019-09-23 LAB — I-STAT BETA HCG BLOOD, ED (MC, WL, AP ONLY): I-stat hCG, quantitative: <5 m[IU]/mL (ref <5)

## 2019-09-23 MED ORDER — PREDNISONE 20 MG PO TABS: 40.0000 mg | ORAL_TABLET | Freq: Every day | ORAL | 0 refills | Status: AC

## 2019-09-23 MED ORDER — ACETAMINOPHEN 325 MG PO TABS
650.0000 mg | ORAL_TABLET | Freq: Once | ORAL | Status: AC
  Administered 2019-09-23: 650 mg via ORAL
  Filled 2019-09-23: qty 2

## 2019-09-23 MED ORDER — LIDOCAINE 5 % EX PTCH
1.0000 | MEDICATED_PATCH | Freq: Once | CUTANEOUS | Status: DC
  Administered 2019-09-23: 1 via TRANSDERMAL
  Filled 2019-09-23: qty 1

## 2019-09-23 MED ORDER — LIDOCAINE VISCOUS HCL 2 % MT SOLN: 15.0000 mL | OROMUCOSAL | 0 refills | Status: AC | PRN

## 2019-09-23 MED ORDER — ALBUTEROL SULFATE HFA 108 (90 BASE) MCG/ACT IN AERS
1.0000 | INHALATION_SPRAY | Freq: Once | RESPIRATORY_TRACT | Status: DC
  Filled 2019-09-23: qty 6.7

## 2019-09-23 MED ORDER — LIDOCAINE VISCOUS HCL 2 % MT SOLN
15.0000 mL | Freq: Once | OROMUCOSAL | Status: AC
  Administered 2019-09-23: 15 mL via OROMUCOSAL
  Filled 2019-09-23: qty 15

## 2019-09-23 NOTE — Discharge Instructions (Addendum)
Thank you for allowing [place] to care for you today.   Please return to the emergency department if you have any new or worsening symptoms.  Your Covid test is pending.  You will be notified in 24 hours if it is positive.  If it is negative it will be available in your MyChart. -Your flu test today was negative.  -Prescription sent to your pharmacy for prednisone.  This is a steroid to treat asthma exacerbations.  Please take as prescribed. -Prescription also sent for viscous lidocaine.  This is used to treat sore throat.  Please take as prescribed and if needed.  Medications- You can take medications to help treat your symptoms: -Tylenol for fever and body aches. Please take as prescribed on the bottle. -Over the coutner cough medicine such as mucinex, robitussin, or other brands. -Flonase for nasal congestion  Treatment- This is a virus and unfortunately there are no antibitotics approved to treat this virus at this time. It is important to monitor your symptoms closely: -You should have a theremometer at home to check your temperature when feeling feverish. -Use a pulse ox meter to measure your oxygen when feeling short of breath.  -If your fever is over 100.4 despite taking tylenol or if your oxygen level drops below 94% these are reasons to rturn to the emergency department for further evaluation. Please call the emergency department before you come to make [place] aware.    We recommend you self-isolate for 10 days and to inform your work/family/friends that you has the virus.  They will need to self-quarantine for 14 days to monitor for symptoms.    Again: symptoms of shortness of breath, chest pain, difficulty breathing, new onset of confuison, any symptoms that are concerning. And you or the person should come to emergency department for evaluation.   I hope you feel better soon

## 2019-09-23 NOTE — ED Notes (Signed)
Pulse ox remained at 98% while ambulating
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
## 2019-09-24 LAB — NOVEL CORONAVIRUS, NAA (HOSP ORDER, SEND-OUT TO REF LAB; TAT 18-24 HRS): SARS-CoV-2, NAA: NOT DETECTED

## 2019-10-24 IMAGING — CR DG CHEST 2V
2 series · 2 of 2 positions shown · non-contrast
Comparison: 02/24/2017

CLINICAL DATA: Headache and abdominal pain for several weeks, RIGHT
chest pain on palpation, blood in urine since [REDACTED], history of
asthma

EXAM:
CHEST - 2 VIEW

[w chest pa]
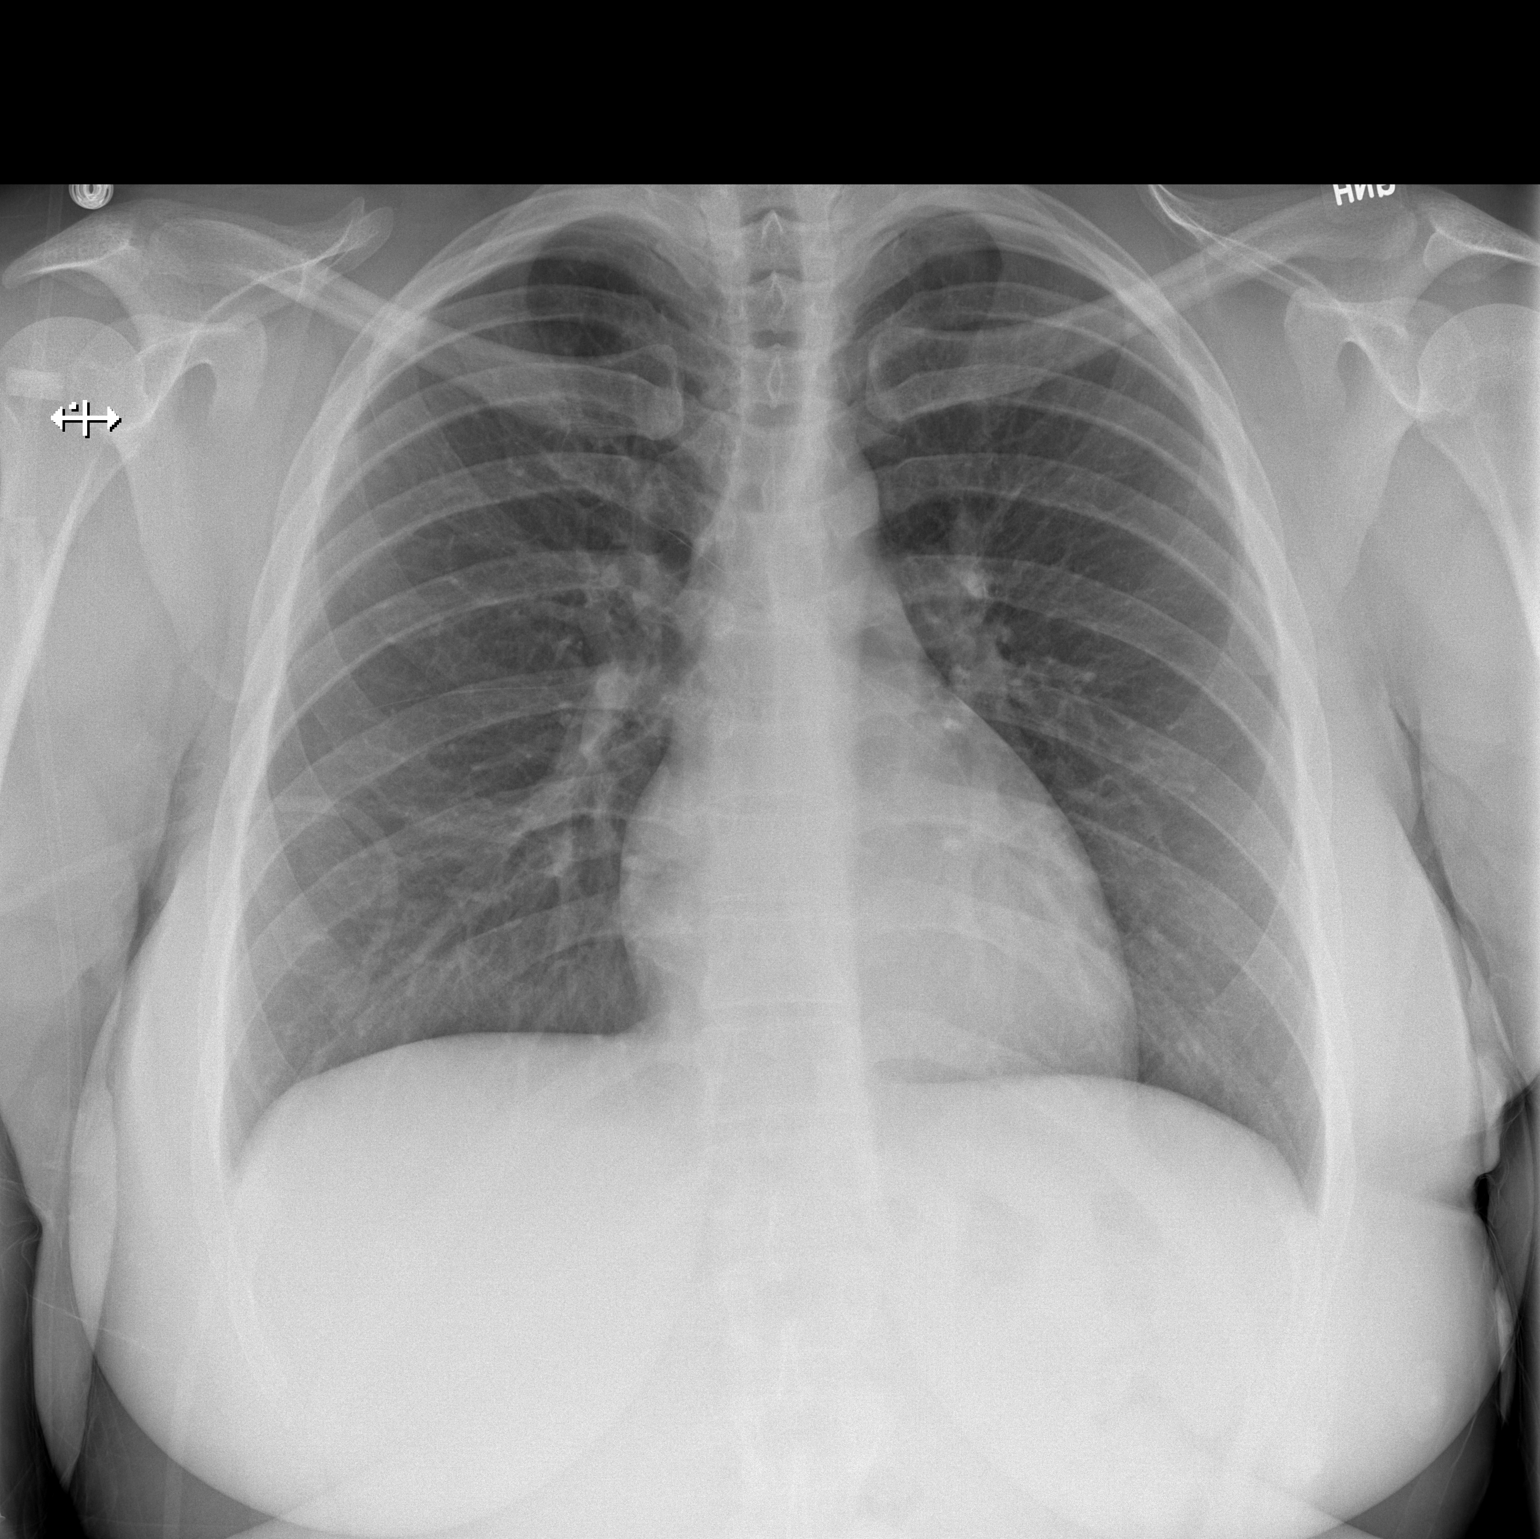

[w chest lat]
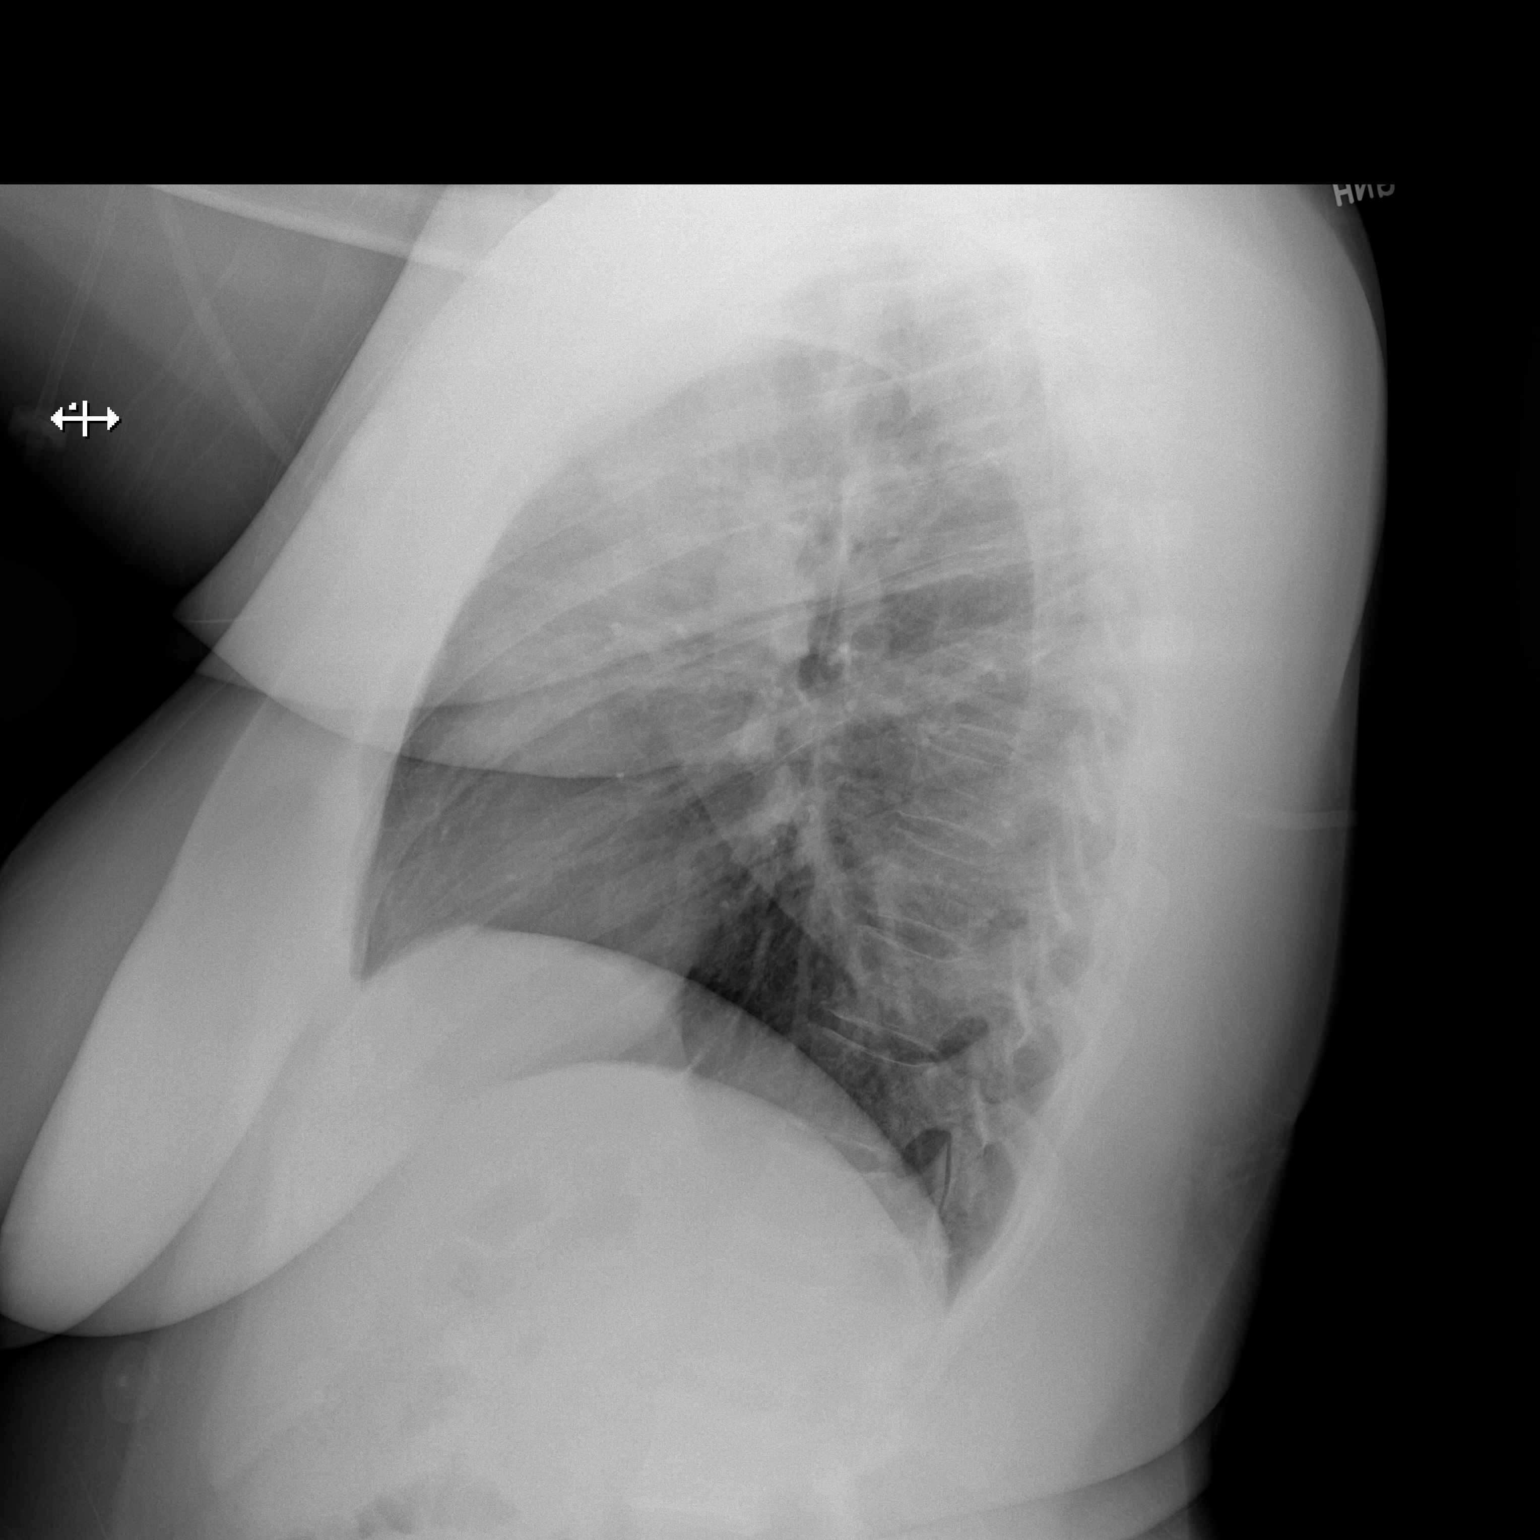

[2 of 2 positions shown; findings below may reference images not displayed]

FINDINGS: Normal heart size, mediastinal contours, and pulmonary vascularity.

Lungs clear.

No pleural effusion or pneumothorax.

Bones unremarkable.
IMPRESSION: Normal exam.

## 2019-10-24 IMAGING — US US PELVIS COMPLETE
1 series · 13 of 25 positions shown · non-contrast
Comparison: None.

CLINICAL DATA: Left lower quadrant pain. Bilateral adnexal
tenderness on exam. Endometriosis. Previous left oophorectomy.

EXAM:
TRANSABDOMINAL AND TRANSVAGINAL ULTRASOUND OF PELVIS
DOPPLER ULTRASOUND OF OVARIES
TECHNIQUE: Both transabdominal and transvaginal ultrasound examinations of the
pelvis were performed. Transabdominal technique was performed for
global imaging of the pelvis including uterus, ovaries, adnexal
regions, and pelvic cul-de-sac.
It was necessary to proceed with endovaginal exam following the
transabdominal exam to visualize the endometrial stripe and ovaries.
Color and duplex Doppler ultrasound was utilized to evaluate blood
flow to the ovaries.

[Series 1: us pelvis complete · 13 of 220 slices shown]
[im 1/220]
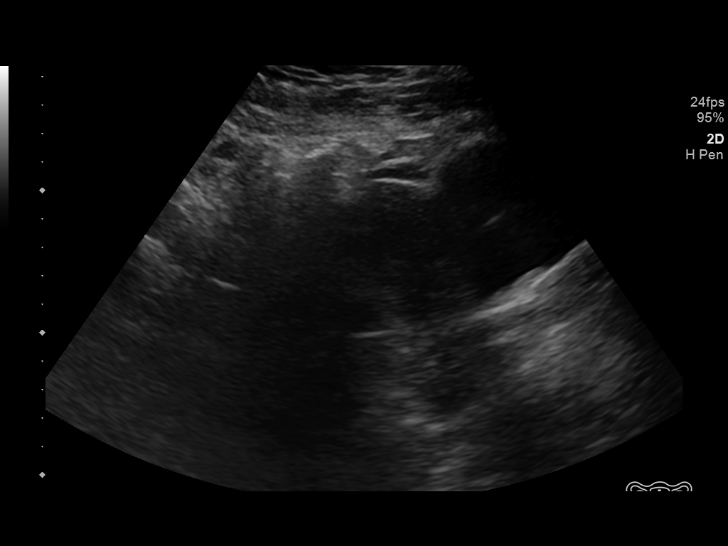
[im 19/220]
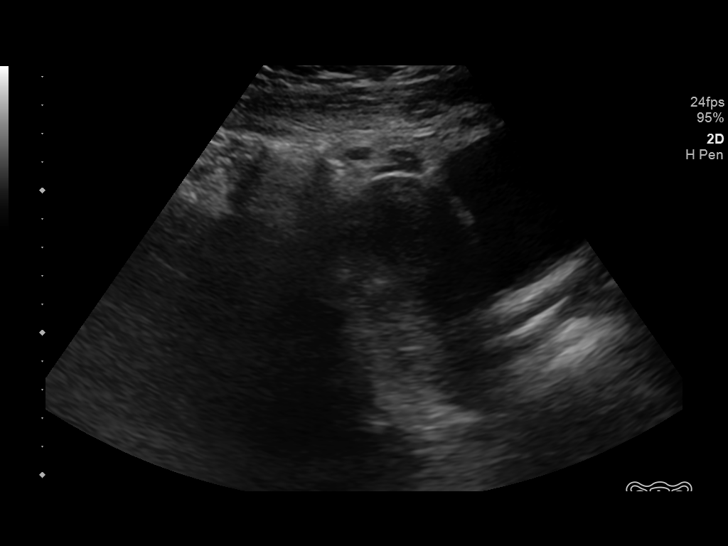
[im 37/220]
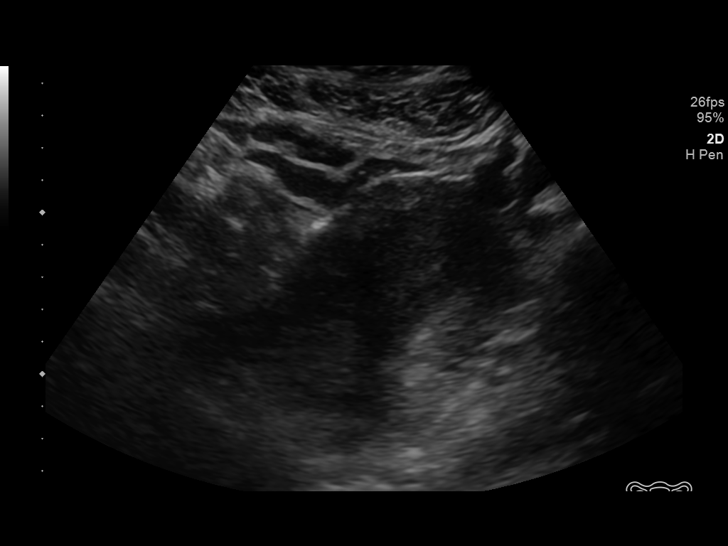
[im 55/220]
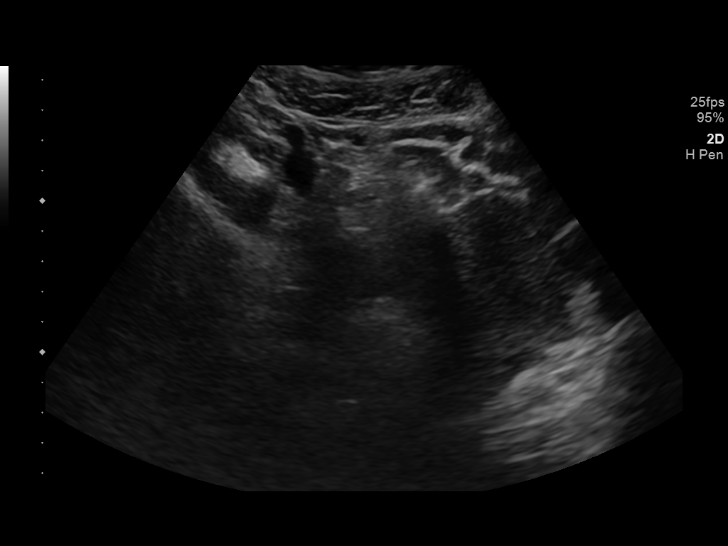
[im 74/220]
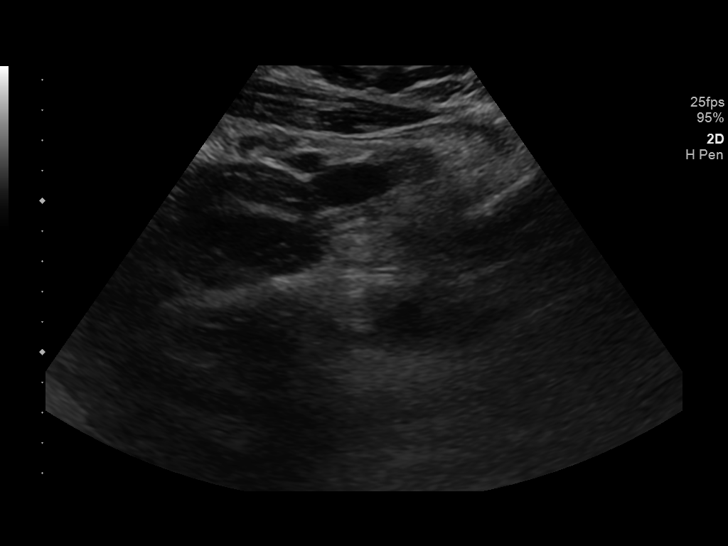
[im 92/220]
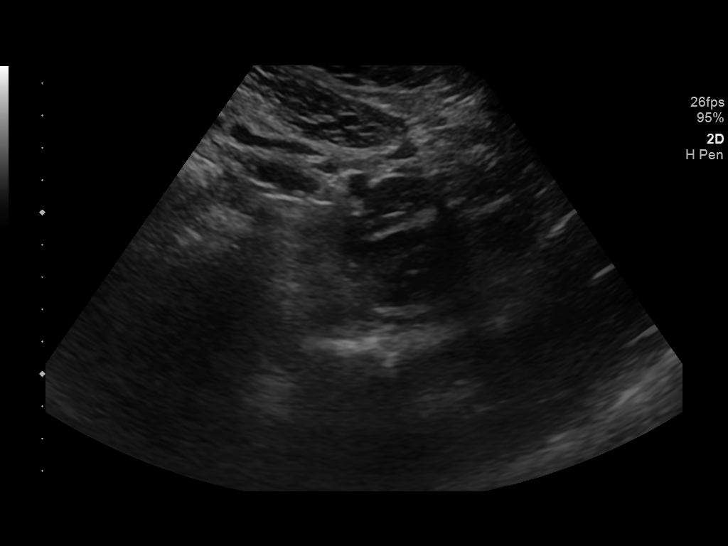
[im 110/220]
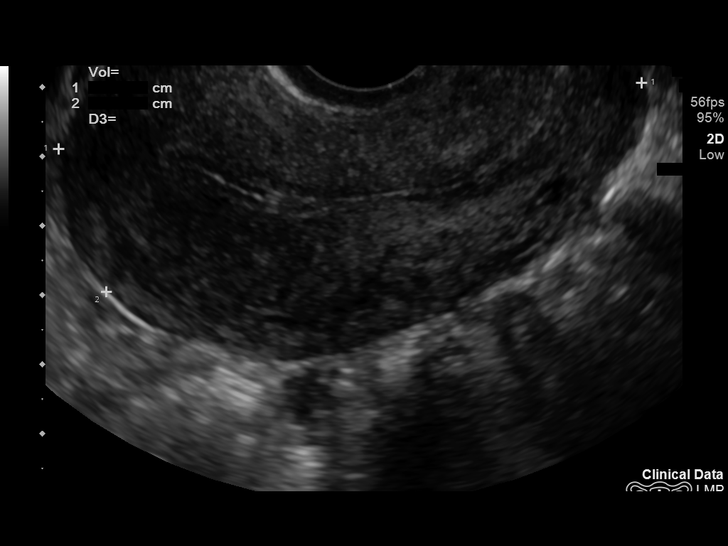
[im 128/220]
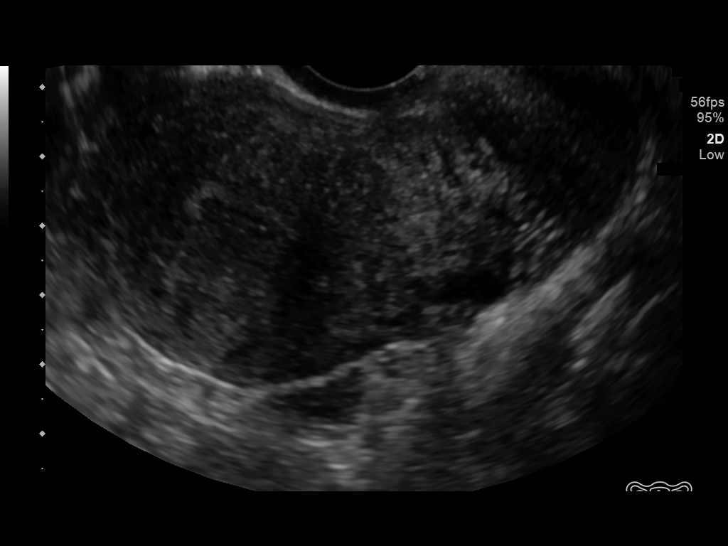
[im 147/220]
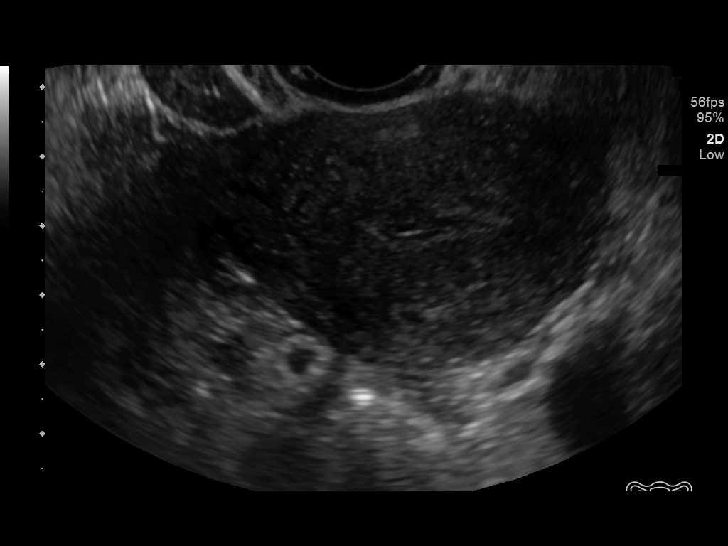
[im 165/220]
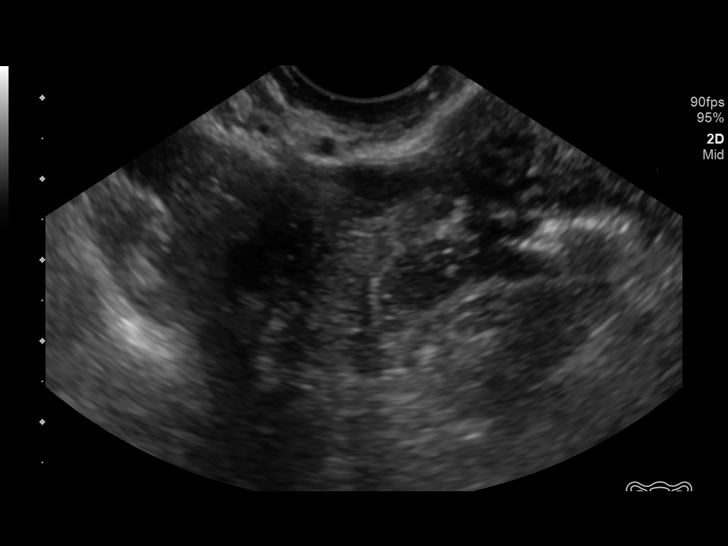
[im 183/220]
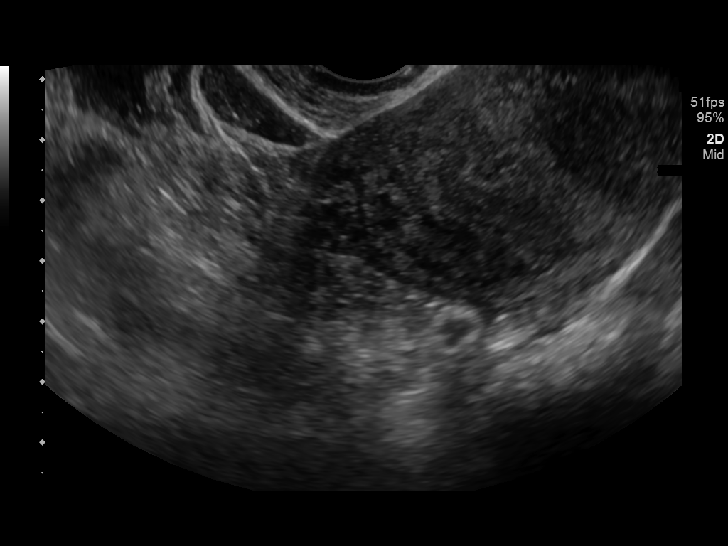
[im 201/220]
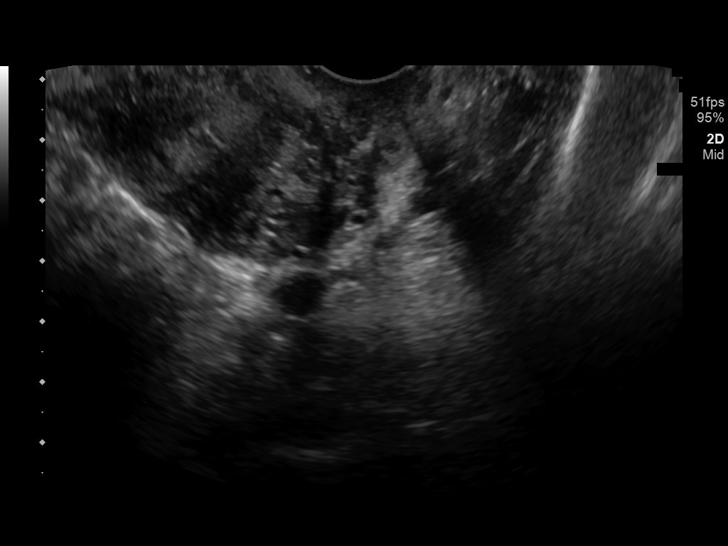
[im 220/220]
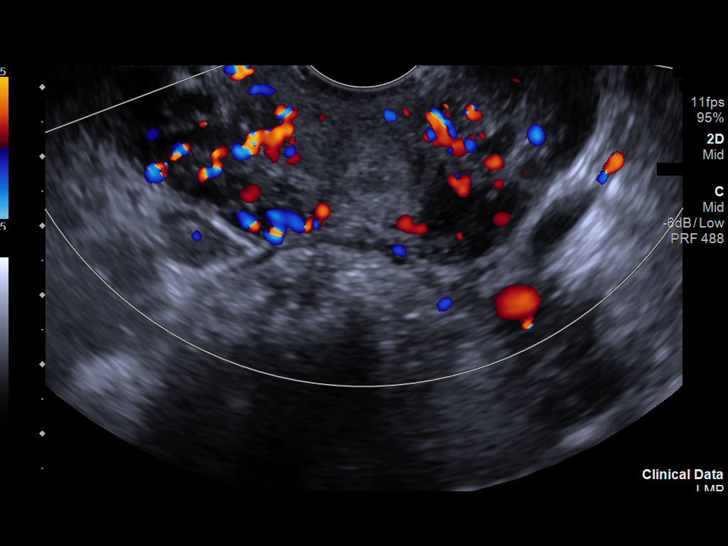

[13 of 25 positions shown; findings below may reference images not displayed]

FINDINGS: Uterus

Measurements: 8.5 x 4.0 x 4.5 cm = volume: 80 mL. No fibroids or
other mass visualized.

Endometrium

Thickness: 5 mm.  No focal abnormality visualized.

Right ovary

Measurements: 2.5 x 1.7 x 1.5 cm = volume: 3.4 mL. Normal
appearance/no adnexal mass.

Left ovary

Measurements: Surgically absent.  No adnexal mass identified.

Pulsed Doppler evaluation of the right ovary demonstrates normal
low-resistance arterial and venous waveforms.

Other findings

No abnormal free fluid.
IMPRESSION: Normal appearance of uterus and right ovary.

Previous left oophorectomy. No pelvic mass or other significant
abnormality identified.

No sonographic evidence for right ovarian torsion.

## 2020-04-11 ENCOUNTER — Emergency Department (HOSPITAL_COMMUNITY)
Admission: EM | Admit: 2020-04-11 | Discharge: 2020-04-11 | Disposition: A | Discharge status: Home / Self Care | Payer: No Typology Code available for payment source | Attending: Emergency Medicine | Admitting: Emergency Medicine

## 2020-04-11 ENCOUNTER — Other Ambulatory Visit: Payer: Self-pay

## 2020-04-11 ENCOUNTER — Emergency Department (HOSPITAL_COMMUNITY): Payer: No Typology Code available for payment source

## 2020-04-11 ENCOUNTER — Encounter (HOSPITAL_COMMUNITY): Payer: Self-pay | Admitting: *Deleted

## 2020-04-11 DIAGNOSIS — Z79899 Other long term (current) drug therapy: Secondary | ICD-10-CM | POA: Diagnosis not present

## 2020-04-11 DIAGNOSIS — Y999 Unspecified external cause status: Secondary | ICD-10-CM | POA: Insufficient documentation

## 2020-04-11 DIAGNOSIS — Y92410 Unspecified street and highway as the place of occurrence of the external cause: Secondary | ICD-10-CM | POA: Insufficient documentation

## 2020-04-11 DIAGNOSIS — R0781 Pleurodynia: Secondary | ICD-10-CM | POA: Insufficient documentation

## 2020-04-11 DIAGNOSIS — J45909 Unspecified asthma, uncomplicated: Secondary | ICD-10-CM | POA: Diagnosis not present

## 2020-04-11 DIAGNOSIS — S5012XA Contusion of left forearm, initial encounter: Secondary | ICD-10-CM | POA: Insufficient documentation

## 2020-04-11 DIAGNOSIS — M25562 Pain in left knee: Secondary | ICD-10-CM | POA: Diagnosis not present

## 2020-04-11 DIAGNOSIS — Y939 Activity, unspecified: Secondary | ICD-10-CM | POA: Diagnosis not present

## 2020-04-11 DIAGNOSIS — Z7982 Long term (current) use of aspirin: Secondary | ICD-10-CM | POA: Diagnosis not present

## 2020-04-11 DIAGNOSIS — S40022A Contusion of left upper arm, initial encounter: Secondary | ICD-10-CM | POA: Insufficient documentation

## 2020-04-11 DIAGNOSIS — R519 Headache, unspecified: Secondary | ICD-10-CM | POA: Insufficient documentation

## 2020-04-11 DIAGNOSIS — S4992XA Unspecified injury of left shoulder and upper arm, initial encounter: Secondary | ICD-10-CM | POA: Diagnosis present

## 2020-04-11 DIAGNOSIS — M542 Cervicalgia: Secondary | ICD-10-CM | POA: Insufficient documentation

## 2020-04-11 DIAGNOSIS — Z885 Allergy status to narcotic agent status: Secondary | ICD-10-CM | POA: Diagnosis not present

## 2020-04-11 MED ORDER — METHOCARBAMOL 500 MG PO TABS
500.0000 mg | ORAL_TABLET | Freq: Once | ORAL | Status: AC
  Administered 2020-04-11: 500 mg via ORAL
  Filled 2020-04-11: qty 1

## 2020-04-11 MED ORDER — ACETAMINOPHEN 500 MG PO TABS
1000.0000 mg | ORAL_TABLET | Freq: Once | ORAL | Status: AC
  Administered 2020-04-11: 1000 mg via ORAL
  Filled 2020-04-11: qty 2

## 2020-04-11 MED ORDER — NAPROXEN 500 MG PO TABS
500.0000 mg | ORAL_TABLET | Freq: Two times a day (BID) | ORAL | 0 refills | Status: DC
Start: 2020-04-11 — End: 2021-02-14

## 2020-04-11 MED ORDER — METHOCARBAMOL 500 MG PO TABS
500.0000 mg | ORAL_TABLET | Freq: Two times a day (BID) | ORAL | 0 refills | Status: DC
Start: 2020-04-11 — End: 2021-02-14
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
## 2020-04-11 NOTE — ED Notes (Signed)
An After Visit Summary was printed and given to the patient. Discharge instructions given and no further questions at this time.  
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
## 2020-05-24 ENCOUNTER — Emergency Department (HOSPITAL_COMMUNITY)
Admission: EM | Admit: 2020-05-24 | Discharge: 2020-05-24 | Disposition: A | Discharge status: Home / Self Care | Payer: Medicaid Other | Attending: Emergency Medicine | Admitting: Emergency Medicine

## 2020-05-24 ENCOUNTER — Emergency Department (HOSPITAL_COMMUNITY): Payer: Medicaid Other

## 2020-05-24 DIAGNOSIS — R0602 Shortness of breath: Secondary | ICD-10-CM | POA: Diagnosis not present

## 2020-05-24 DIAGNOSIS — J45909 Unspecified asthma, uncomplicated: Secondary | ICD-10-CM | POA: Insufficient documentation

## 2020-05-24 DIAGNOSIS — Z79899 Other long term (current) drug therapy: Secondary | ICD-10-CM | POA: Diagnosis not present

## 2020-05-24 DIAGNOSIS — M791 Myalgia, unspecified site: Secondary | ICD-10-CM | POA: Diagnosis not present

## 2020-05-24 DIAGNOSIS — J069 Acute upper respiratory infection, unspecified: Secondary | ICD-10-CM

## 2020-05-24 DIAGNOSIS — R0981 Nasal congestion: Secondary | ICD-10-CM | POA: Diagnosis not present

## 2020-05-24 DIAGNOSIS — Z20822 Contact with and (suspected) exposure to covid-19: Secondary | ICD-10-CM | POA: Diagnosis not present

## 2020-05-24 DIAGNOSIS — R05 Cough: Secondary | ICD-10-CM | POA: Diagnosis present

## 2020-05-24 LAB — COMPREHENSIVE METABOLIC PANEL
ALT: 13 U/L (ref 0–44)
AST: 18 U/L (ref 15–41)
Albumin: 3.9 g/dL (ref 3.5–5.0)
Alkaline Phosphatase: 67 U/L (ref 38–126)
Anion gap: 8 (ref 5–15)
BUN: 10 mg/dL (ref 6–20)
CO2: 26 mmol/L (ref 22–32)
Calcium: 8.9 mg/dL (ref 8.9–10.3)
Chloride: 106 mmol/L (ref 98–111)
Creatinine, Ser: 0.94 mg/dL (ref 0.44–1.00)
GFR calc Af Amer: >60 mL/min (ref >60)
GFR calc non Af Amer: >60 mL/min (ref >60)
Glucose, Bld: 86 mg/dL (ref 70–99)
Potassium: 3.8 mmol/L (ref 3.5–5.1)
Sodium: 140 mmol/L (ref 135–145)
Total Bilirubin: 0.4 mg/dL (ref 0.3–1.2)
Total Protein: 8.1 g/dL (ref 6.5–8.1)

## 2020-05-24 LAB — URINALYSIS, ROUTINE W REFLEX MICROSCOPIC
Bilirubin Urine: NEGATIVE
Glucose, UA: NEGATIVE mg/dL
Hgb urine dipstick: NEGATIVE
Ketones, ur: NEGATIVE mg/dL
Nitrite: NEGATIVE
Protein, ur: NEGATIVE mg/dL
Specific Gravity, Urine: 1.013 (ref 1.005–1.030)
pH: 5 (ref 5.0–8.0)

## 2020-05-24 LAB — CBC WITH DIFFERENTIAL/PLATELET
Abs Immature Granulocytes: 0.01 10*3/uL (ref 0.00–0.07)
Basophils Absolute: 0.1 10*3/uL (ref 0.0–0.1)
Basophils Relative: 1 %
Eosinophils Absolute: 0.1 10*3/uL (ref 0.0–0.5)
Eosinophils Relative: 2 %
HCT: 38.4 % (ref 36.0–46.0)
Hemoglobin: 12.6 g/dL (ref 12.0–15.0)
Immature Granulocytes: 0 %
Lymphocytes Relative: 30 %
Lymphs Abs: 1.9 10*3/uL (ref 0.7–4.0)
MCH: 27.9 pg (ref 26.0–34.0)
MCHC: 32.8 g/dL (ref 30.0–36.0)
MCV: 85.1 fL (ref 80.0–100.0)
Monocytes Absolute: 0.5 10*3/uL (ref 0.1–1.0)
Monocytes Relative: 7 %
Neutro Abs: 3.9 10*3/uL (ref 1.7–7.7)
Neutrophils Relative %: 60 %
Platelets: 294 10*3/uL (ref 150–400)
RBC: 4.51 MIL/uL (ref 3.87–5.11)
RDW: 14.9 % (ref 11.5–15.5)
WBC: 6.4 10*3/uL (ref 4.0–10.5)
nRBC: 0 % (ref 0.0–0.2)

## 2020-05-24 LAB — SARS CORONAVIRUS 2 BY RT PCR (HOSPITAL ORDER, PERFORMED IN ~~LOC~~ HOSPITAL LAB): SARS Coronavirus 2: NEGATIVE

## 2020-05-24 NOTE — ED Provider Notes (Signed)
[facility] DEPT Provider Note   CSN: [id number] Arrival date & time: 05/24/20  3474     History Chief Complaint  Patient presents with  . Cough  . Generalized Body Aches  . Nasal Congestion  . Shortness of Breath    Barbara Haas is a 29 y.o. female.  Patient complains of cough congestion.  Patient has had the symptoms for couple days and has 2 children with  The history is provided by the patient. No language interpreter was used.  Cough Cough characteristics:  Dry and non-productive Sputum characteristics:  Nondescript Severity:  Mild Onset quality:  Sudden Timing:  Constant Progression:  Waxing and waning Chronicity:  New Smoker: no   Relieved by:  None tried Associated symptoms: shortness of breath   Associated symptoms: no chest pain, no eye discharge, no headaches and no rash   Shortness of Breath Associated symptoms: cough   Associated symptoms: no abdominal pain, no chest pain, no headaches and no rash        Past Medical History:  Diagnosis Date  . Anxiety   . Asthma   . Back pain affecting pregnancy 03/11/2017  . Common migraine with intractable migraine 12/21/2018  . Complication of anesthesia    bp drops  . Endometriosis   . Headache 03/11/2017  . Nausea and vomiting during pregnancy prior to [redacted] weeks gestation 03/11/2017  . Neck pain 03/11/2017  . Sickle cell trait [facility])     Patient Active Problem List   Diagnosis Date Noted  . Common migraine with intractable migraine 12/21/2018  . Indication for care in labor or delivery 10/09/2017  . Spontaneous vaginal delivery 10/09/2017  . Nausea and vomiting during pregnancy prior to [redacted] weeks gestation 03/11/2017  . Back pain affecting pregnancy 03/11/2017  . Headache 03/11/2017  . Neck pain 03/11/2017  . SVD (spontaneous vaginal delivery) 12/22/2015  . Postpartum care following vaginal delivery 12/22/2015  . Indication for care in labor and delivery, antepartum  12/21/2015    Past Surgical History:  Procedure Laterality Date  . OOPHORECTOMY     left  . OVARIAN CYST REMOVAL    . WISDOM TOOTH EXTRACTION       OB History    Gravida  3   Para  2   Term  2   Preterm      AB  1   Living  2     SAB  1   TAB      Ectopic      Multiple  0   Live Births  2           Family History  Adopted: Yes  Problem Relation Age of Onset  . COPD Mother   . Hypertension Father     Social History   Tobacco Use  . Smoking status: Never Smoker  . Smokeless tobacco: Never Used  Vaping Use  . Vaping Use: Never used  Substance Use Topics  . Alcohol use: Yes  . Drug use: No    Home Medications Prior to Admission medications   Medication Sig Start Date End Date Taking? Authorizing Provider  albuterol (PROVENTIL HFA;VENTOLIN HFA) 108 (90 Base) MCG/ACT inhaler Inhale 2 puffs into the lungs every 4 (four) hours as needed for wheezing or shortness of breath.    Yes [provider]  Levonorgestrel-Ethinyl Estradiol (AMETHIA,CAMRESE) 0.15-0.03 &0.01 MG tablet Take 1 tablet by mouth daily. 12/08/17  Yes [provider]  lidocaine (XYLOCAINE) 2 % solution Use  as directed 15 mLs in the mouth or throat as needed for mouth pain. 09/23/19  Yes Albrizze, Kaitlyn E, PA-C  methocarbamol (ROBAXIN) 500 MG tablet Take 1 tablet (500 mg total) by mouth 2 (two) times daily. Patient not taking: Reported on 05/24/2020 04/11/20   Henderly, Britni A, PA-C  naproxen (NAPROSYN) 500 MG tablet Take 1 tablet (500 mg total) by mouth 2 (two) times daily. Patient not taking: Reported on 05/24/2020 04/11/20   Henderly, Britni A, PA-C  ondansetron (ZOFRAN ODT) 4 MG disintegrating tablet Take 1 tablet (4 mg total) by mouth every 8 (eight) hours as needed for nausea or vomiting. Patient not taking: Reported on 09/23/2019 09/10/19   Sherwood Gambler, MD  ondansetron (ZOFRAN) 4 MG tablet Take 1 tablet (4 mg total) by mouth every 6 (six) hours. Patient not  taking: Reported on 09/23/2019 10/19/18   Valarie Merino, MD  propranolol (INDERAL) 20 MG tablet One tablet twice a day for 2 weeks, then take 2 twice a day Patient not taking: Reported on 09/23/2019 12/21/18   Kathrynn Ducking, MD  rizatriptan (MAXALT) 10 MG tablet Take 1 tablet (10 mg total) by mouth 3 (three) times daily as needed for migraine. Patient not taking: Reported on 09/23/2019 12/21/18   Kathrynn Ducking, MD    Allergies    Shellfish allergy, Codeine, Contrast media [iodinated diagnostic agents], and Other  Review of Systems   Review of Systems  Constitutional: Negative for appetite change and fatigue.  HENT: Negative for congestion, ear discharge and sinus pressure.   Eyes: Negative for discharge.  Respiratory: Positive for cough and shortness of breath.   Cardiovascular: Negative for chest pain.  Gastrointestinal: Negative for abdominal pain and diarrhea.  Genitourinary: Negative for frequency and hematuria.  Musculoskeletal: Negative for back pain.  Skin: Negative for rash.  Neurological: Negative for seizures and headaches.  Psychiatric/Behavioral: Negative for hallucinations.    Physical Exam Updated Vital Signs BP (!) 165/98   Pulse 83   Temp 99.4 F (37.4 C) (Oral)   Resp 16   LMP 03/14/2020   SpO2 100%   Physical Exam Vitals and nursing note reviewed.  Constitutional:      Appearance: She is well-developed.  HENT:     Head: Normocephalic.     Mouth/Throat:     Mouth: Mucous membranes are moist.  Eyes:     General: No scleral icterus.    Conjunctiva/sclera: Conjunctivae normal.  Neck:     Thyroid: No thyromegaly.  Cardiovascular:     Rate and Rhythm: Normal rate and regular rhythm.     Heart sounds: No murmur heard.  No friction rub. No gallop.   Pulmonary:     Breath sounds: No stridor. No wheezing or rales.  Chest:     Chest wall: No tenderness.  Abdominal:     General: There is no distension.     Tenderness: There is no abdominal  tenderness. There is no rebound.  Musculoskeletal:        General: Normal range of motion.     Cervical back: Neck supple.  Lymphadenopathy:     Cervical: No cervical adenopathy.  Skin:    Findings: No erythema or rash.  Neurological:     Mental Status: She is alert and oriented to person, place, and time.     Motor: No abnormal muscle tone.     Coordination: Coordination normal.  Psychiatric:        Behavior: Behavior normal.     ED  Results / Procedures / Treatments   Labs (all labs ordered are listed, but only abnormal results are displayed) Labs Reviewed  SARS CORONAVIRUS 2 BY RT PCR ([place])  CBC WITH DIFFERENTIAL/PLATELET  COMPREHENSIVE METABOLIC PANEL  URINALYSIS, ROUTINE W REFLEX MICROSCOPIC    EKG None  Radiology DG Chest 2 View  Result Date: 05/24/2020 CLINICAL DATA:  Cough EXAM: CHEST - 2 VIEW COMPARISON:  April 11, 2020 FINDINGS: The cardiomediastinal silhouette is normal in contour. No pleural effusion. No pneumothorax. No acute pleuroparenchymal abnormality. Visualized abdomen is unremarkable. No acute osseous abnormality noted. IMPRESSION: No acute cardiopulmonary abnormality. Electronically Signed   By: Valentino Saxon MD   On: 05/24/2020 09:31    Procedures Procedures (including critical care time)  Medications Ordered in ED Medications - No data to display  ED Course  I have reviewed the triage vital signs and the nursing notes.  Pertinent labs & imaging results that were available during my care of the patient were reviewed by me and considered in my medical decision making (see chart for details).    MDM Rules/Calculators/A&P                         X-ray CBC chemistries are all unremarkable.  Covid test negative.  Patient with viral syndrome and will follow up as needed Final Clinical Impression(s) / ED Diagnoses Final diagnoses:  None    Rx / DC Orders ED Discharge Orders    None         Milton Ferguson, MD 05/24/20 1631

## 2020-05-24 NOTE — Discharge Instructions (Addendum)
Drink a lot of fluids take Tylenol for fever or aches and follow-up as needed

## 2020-05-24 NOTE — ED Triage Notes (Signed)
Headache since Saturday. shob with exertion. Nose running, sore throat, eye watery, chills and hot flashes, body aches. Reports both her kids have been sick with cold lately and she has been taking care of them.  Cough with phlegm that is greenish color, last night had bilat ear ringing.

## 2020-09-29 IMAGING — CT CT ABD-PELV W/O CM
2 of 4 series · 16 of 46 positions shown, 18 images · non-contrast
Comparison: None.

CLINICAL DATA: Abdominal pain with diverticulitis suspected.

EXAM:
CT ABDOMEN AND PELVIS WITHOUT CONTRAST
TECHNIQUE: Multidetector CT imaging of the abdomen and pelvis was performed
following the standard protocol without IV contrast.

[Series 3: axial st · axial · 0.75mm/px · z∈[+1146,+1566]mm · 13 of 94 slices shown, 15 images]
[im 5/94  soft-tissue]
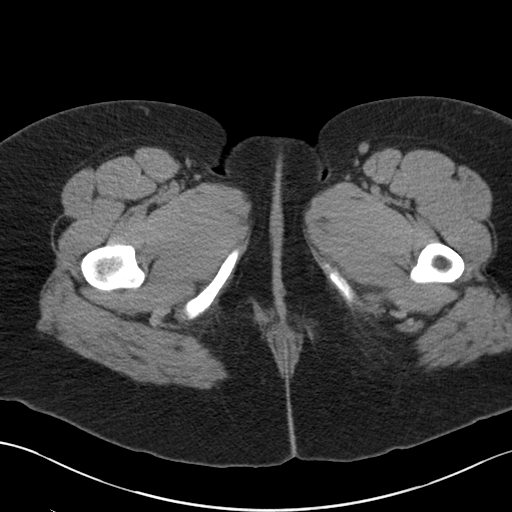
[im 5/94  bone]
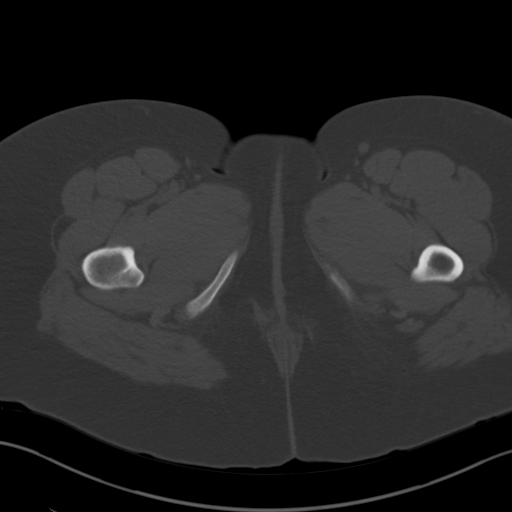
[im 14/94  soft-tissue]
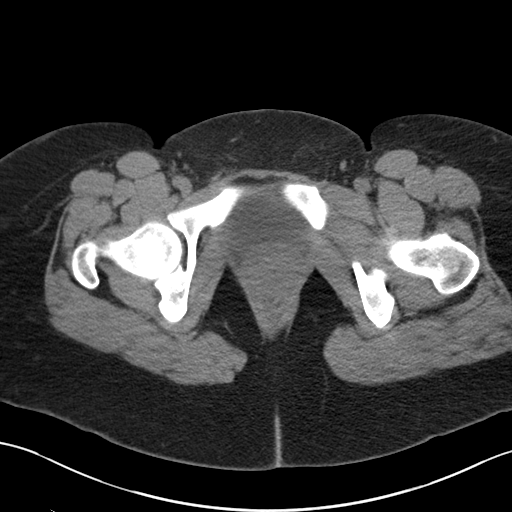
[im 18/94  soft-tissue]
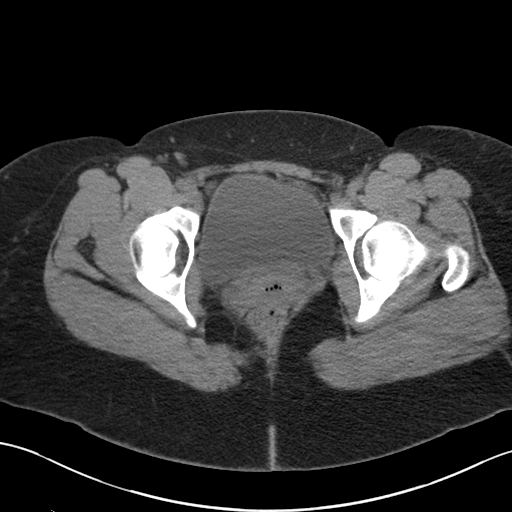
[im 27/94  soft-tissue]
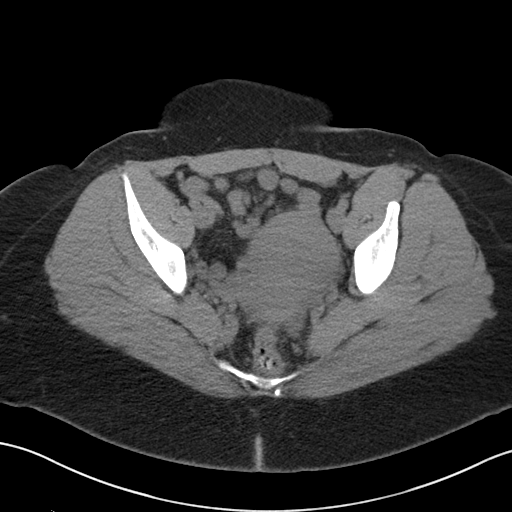
[im 32/94  soft-tissue]
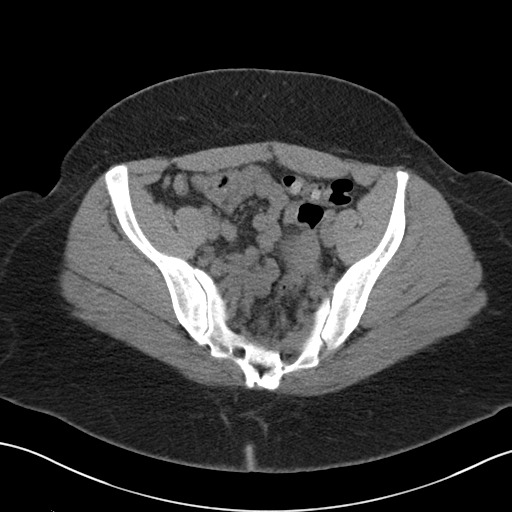
[im 40/94  soft-tissue]
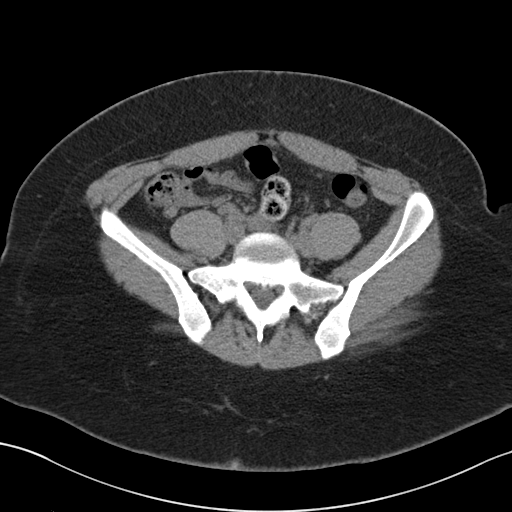
[im 49/94  soft-tissue]
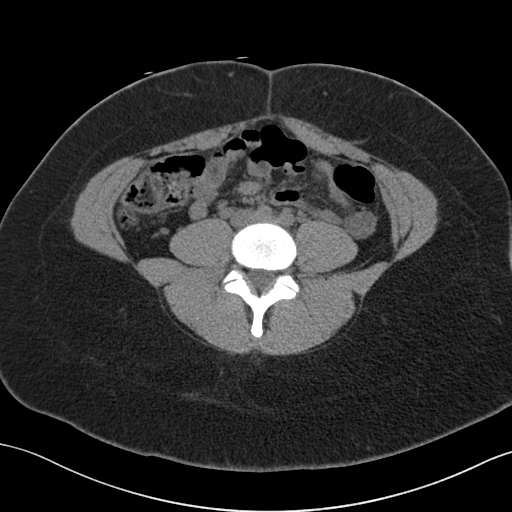
[im 54/94  soft-tissue]
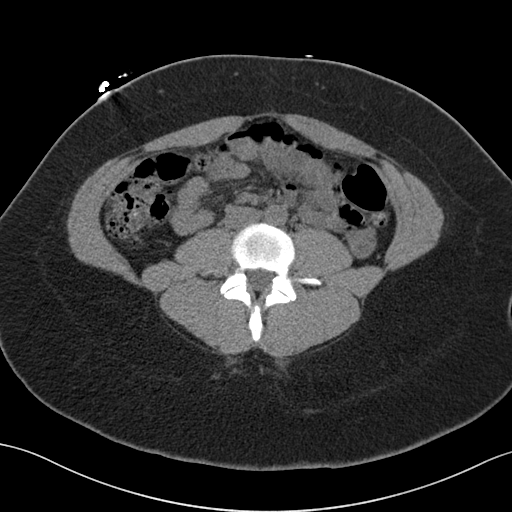
[im 63/94  soft-tissue]
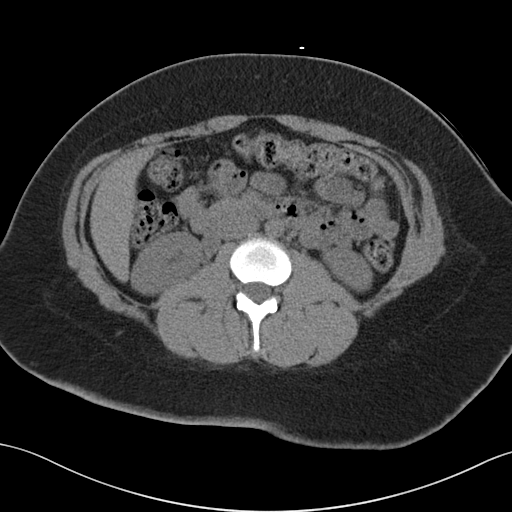
[im 63/94  bone]
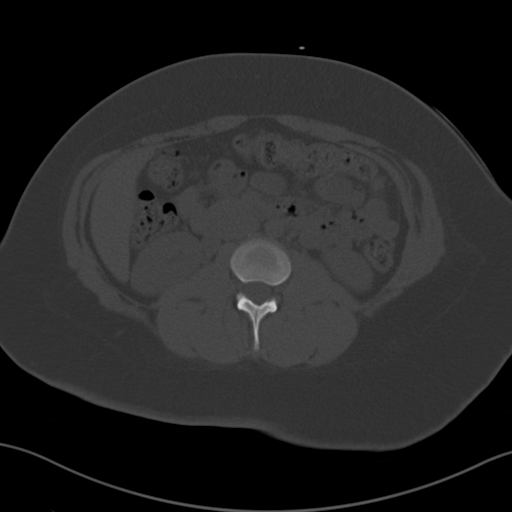
[im 67/94  soft-tissue]
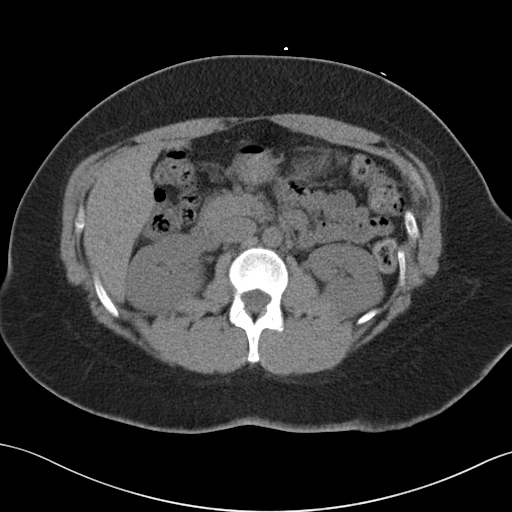
[im 76/94  soft-tissue]
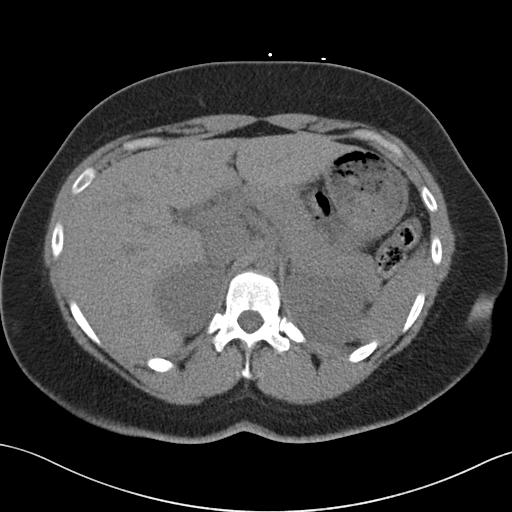
[im 80/94  soft-tissue]
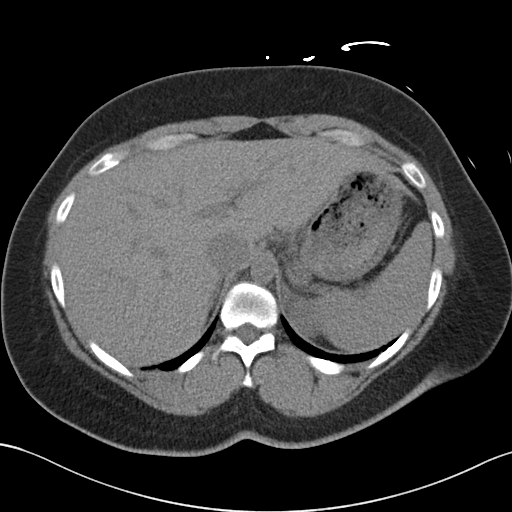
[im 89/94  soft-tissue]
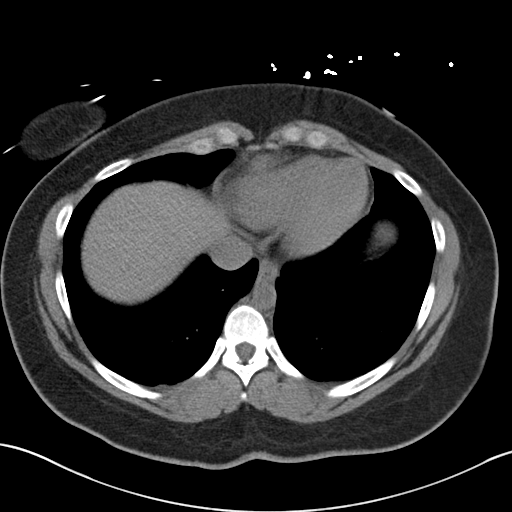

[Series 5: coronal st · coronal · 0.82mm/px · 3 of 151 slices shown]
[im 51/151  soft-tissue]
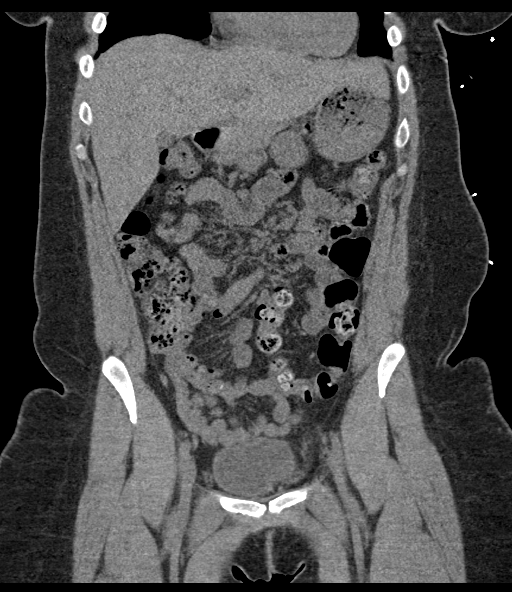
[im 67/151  soft-tissue]
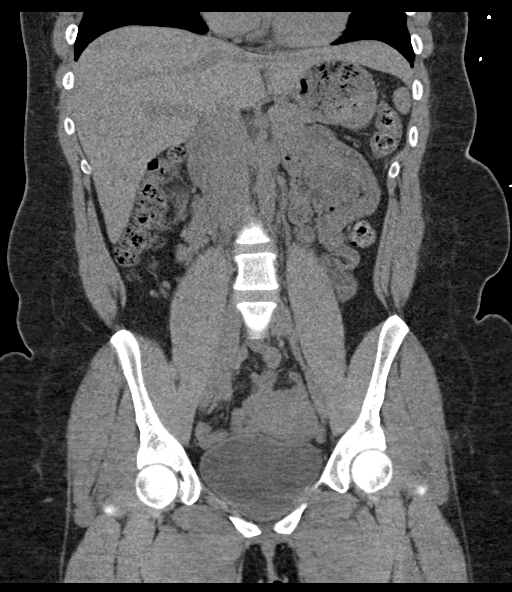
[im 84/151  soft-tissue]
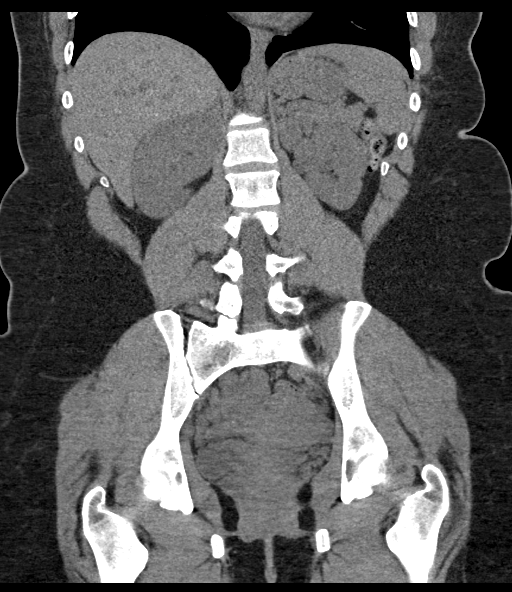

[16 of 46 positions shown; findings below may reference images not displayed]

FINDINGS: Lower chest: The lung bases are clear. The heart size is normal.

Hepatobiliary: The liver is normal. Normal gallbladder.There is no
biliary ductal dilation.

Pancreas: Normal contours without ductal dilatation. No
peripancreatic fluid collection.

Spleen: No splenic laceration or hematoma.

Adrenals/Urinary Tract:

--Adrenal glands: No adrenal hemorrhage.

--Right kidney/ureter: No hydronephrosis or perinephric hematoma.

--Left kidney/ureter: No hydronephrosis or perinephric hematoma.

--Urinary bladder: Unremarkable.

Stomach/Bowel:

--Stomach/Duodenum: No hiatal hernia or other gastric abnormality.
Normal duodenal course and caliber.

--Small bowel: No dilatation or inflammation.

--Colon: No focal abnormality.

--Appendix: Normal.

Vascular/Lymphatic: Normal course and caliber of the major abdominal
vessels.

--No retroperitoneal lymphadenopathy.

--No mesenteric lymphadenopathy.

--No pelvic or inguinal lymphadenopathy.

Reproductive: Unremarkable

Other: No ascites or free air. The abdominal wall is normal.

Musculoskeletal. No acute displaced fractures.
IMPRESSION: No acute abdominopelvic abnormality.

## 2020-10-12 IMAGING — DX DG CHEST 1V PORT
1 series · 1 of 1 positions shown · non-contrast
Comparison: 09/10/2019

CLINICAL DATA: Cough.  Asthma.  Headache.

EXAM:
PORTABLE CHEST 1 VIEW

[chest ap]
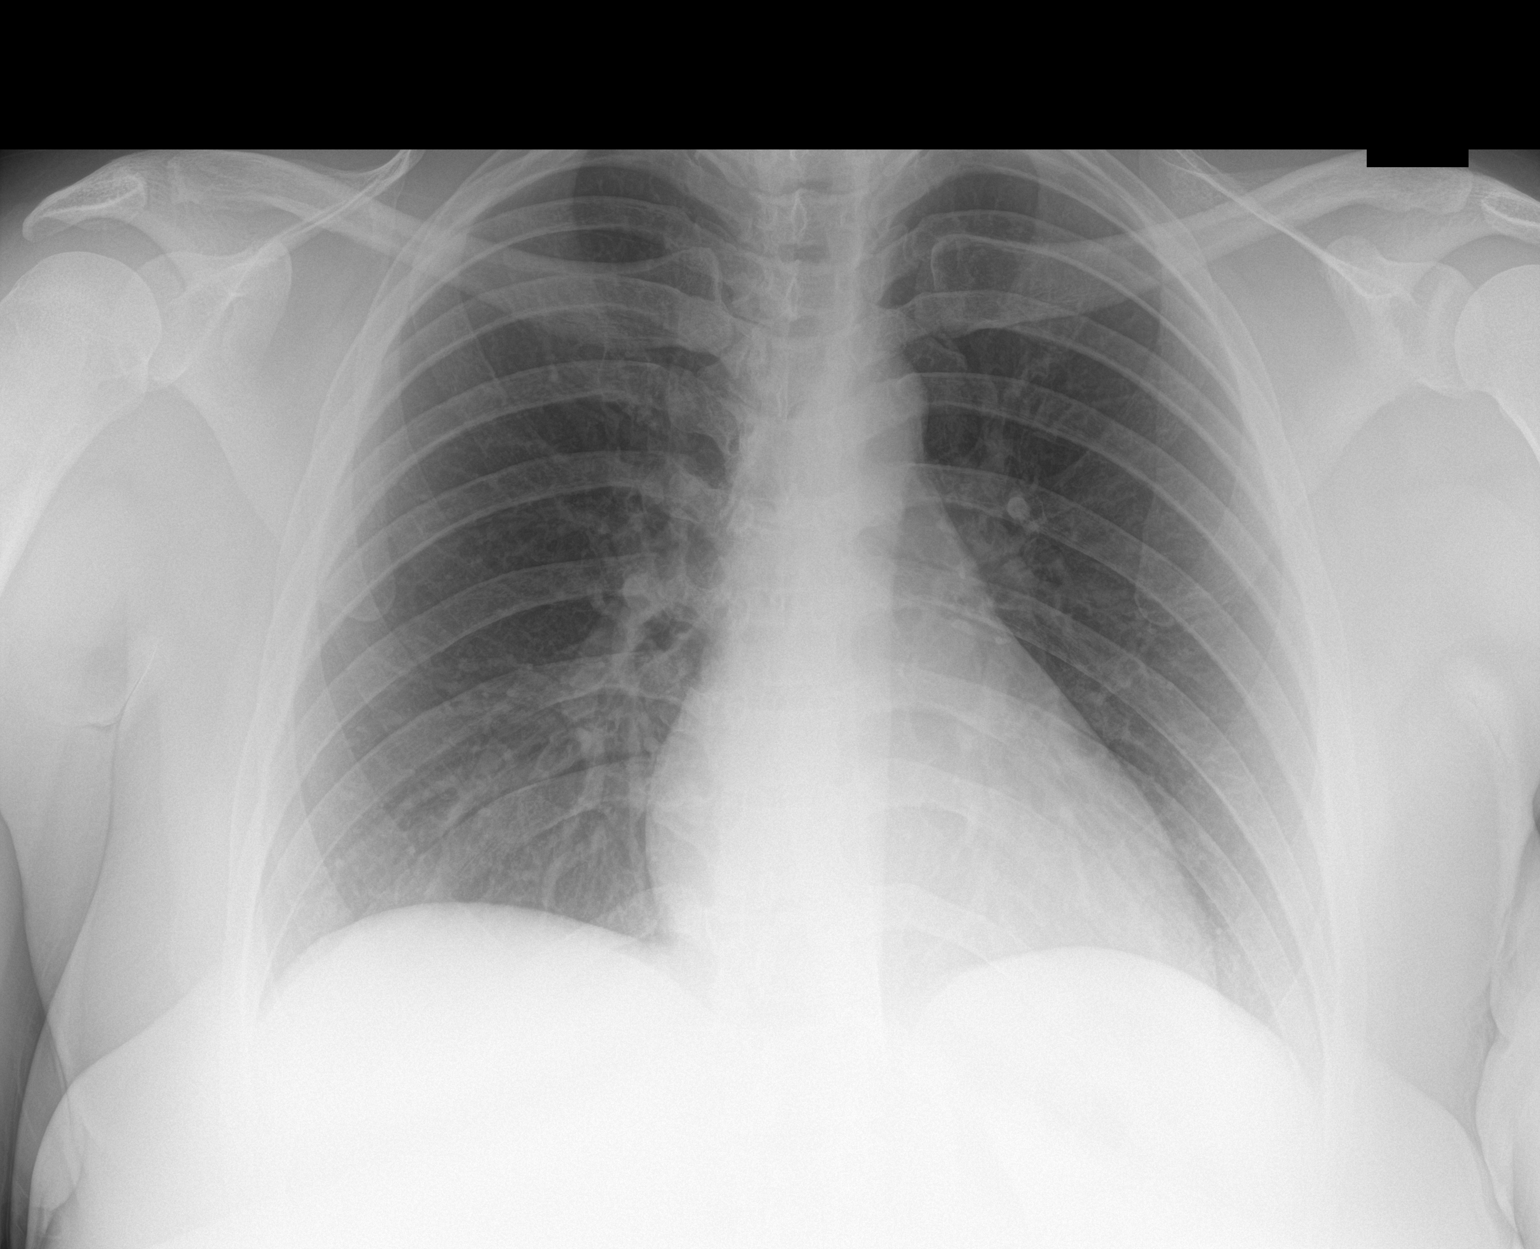

[1 of 1 positions shown; findings below may reference images not displayed]

FINDINGS: The heart size and mediastinal contours are within normal limits.
Both lungs are clear. The visualized skeletal structures are
unremarkable.
IMPRESSION: No active disease.

## 2021-01-23 ENCOUNTER — Other Ambulatory Visit: Payer: Self-pay | Admitting: Family Medicine

## 2021-01-23 DIAGNOSIS — N644 Mastodynia: Secondary | ICD-10-CM

## 2021-02-14 ENCOUNTER — Other Ambulatory Visit: Payer: Self-pay

## 2021-02-14 ENCOUNTER — Emergency Department (HOSPITAL_COMMUNITY)
Admission: EM | Admit: 2021-02-14 | Discharge: 2021-02-14 | Disposition: A | Discharge status: Home / Self Care | Payer: Medicaid Other | Attending: Emergency Medicine | Admitting: Emergency Medicine

## 2021-02-14 ENCOUNTER — Emergency Department (HOSPITAL_COMMUNITY): Payer: Medicaid Other

## 2021-02-14 ENCOUNTER — Encounter (HOSPITAL_COMMUNITY): Payer: Self-pay

## 2021-02-14 DIAGNOSIS — H9202 Otalgia, left ear: Secondary | ICD-10-CM | POA: Diagnosis not present

## 2021-02-14 DIAGNOSIS — J111 Influenza due to unidentified influenza virus with other respiratory manifestations: Secondary | ICD-10-CM | POA: Insufficient documentation

## 2021-02-14 DIAGNOSIS — J45909 Unspecified asthma, uncomplicated: Secondary | ICD-10-CM | POA: Diagnosis not present

## 2021-02-14 DIAGNOSIS — R0981 Nasal congestion: Secondary | ICD-10-CM | POA: Diagnosis present

## 2021-02-14 DIAGNOSIS — R197 Diarrhea, unspecified: Secondary | ICD-10-CM | POA: Insufficient documentation

## 2021-02-14 DIAGNOSIS — Z20822 Contact with and (suspected) exposure to covid-19: Secondary | ICD-10-CM | POA: Diagnosis not present

## 2021-02-14 LAB — GROUP A STREP BY PCR: Group A Strep by PCR: NOT DETECTED

## 2021-02-14 LAB — RESP PANEL BY RT-PCR (FLU A&B, COVID) ARPGX2
Influenza A by PCR: NEGATIVE
Influenza B by PCR: NEGATIVE
SARS Coronavirus 2 by RT PCR: NEGATIVE

## 2021-02-14 MED ORDER — ALUM & MAG HYDROXIDE-SIMETH 200-200-20 MG/5ML PO SUSP
30.0000 mL | Freq: Once | ORAL | Status: AC
  Administered 2021-02-14: 30 mL via ORAL
  Filled 2021-02-14: qty 30

## 2021-02-14 MED ORDER — NAPROXEN 500 MG PO TABS: 500.0000 mg | ORAL_TABLET | Freq: Two times a day (BID) | ORAL | 0 refills | Status: AC | PRN

## 2021-02-14 MED ORDER — LIDOCAINE VISCOUS HCL 2 % MT SOLN
15.0000 mL | Freq: Once | OROMUCOSAL | Status: AC
  Administered 2021-02-14: 15 mL via ORAL
  Filled 2021-02-14: qty 15

## 2021-02-14 MED ORDER — FLUTICASONE PROPIONATE 50 MCG/ACT NA SUSP: 1.0000 | Freq: Every day | NASAL | 0 refills | Status: AC

## 2021-02-14 MED ORDER — BENZONATATE 100 MG PO CAPS: 100.0000 mg | ORAL_CAPSULE | Freq: Three times a day (TID) | ORAL | 0 refills | Status: AC

## 2021-02-14 MED ORDER — ACETAMINOPHEN 325 MG PO TABS
650.0000 mg | ORAL_TABLET | Freq: Once | ORAL | Status: AC
  Administered 2021-02-14: 650 mg via ORAL
  Filled 2021-02-14: qty 2

## 2021-02-14 MED ORDER — DEXAMETHASONE SODIUM PHOSPHATE 10 MG/ML IJ SOLN
10.0000 mg | Freq: Once | INTRAMUSCULAR | Status: AC
  Administered 2021-02-14: 10 mg via INTRAMUSCULAR
  Filled 2021-02-14: qty 1

## 2021-02-14 NOTE — ED Provider Notes (Signed)
[facility] DEPT Provider Note   CSN: [id number] Arrival date & time: 02/14/21  0818     History Chief Complaint  Patient presents with  . Sore Throat  . Diarrhea    Barbara Haas is a 30 y.o. female with a history of migraines, asthma, anxiety, and endometriosis who presents to the emergency department with complaints of flulike symptoms for the past 1 week.  Patient reports symptoms started with nasal congestion/rhinorrhea with subsequent development of sore throat, left ear pain, cough, subjective fever, chills, and generalized body aches.  She is had a few episodes of diarrhea as well.  With her array of symptoms she feels her asthma has been irritated, she is using her inhaler with some improvement of this.  No other alleviating or aggravating factors.  Her daughter was sick with similar symptoms, was found to be teething, also was told that she had a oral infection.  Patient states that this somewhat feels like when she had the flu.  She had a negative rapid COVID test at home.  She denies chest pain, abdominal pain, melena, hematochezia, dysuria, or leg pain/swelling.  HPI     Past Medical History:  Diagnosis Date  . Anxiety   . Asthma   . Back pain affecting pregnancy 03/11/2017  . Common migraine with intractable migraine 12/21/2018  . Complication of anesthesia    bp drops  . Endometriosis   . Headache 03/11/2017  . Nausea and vomiting during pregnancy prior to [redacted] weeks gestation 03/11/2017  . Neck pain 03/11/2017  . Sickle cell trait [facility])     Patient Active Problem List   Diagnosis Date Noted  . Common migraine with intractable migraine 12/21/2018  . Indication for care in labor or delivery 10/09/2017  . Spontaneous vaginal delivery 10/09/2017  . Nausea and vomiting during pregnancy prior to [redacted] weeks gestation 03/11/2017  . Back pain affecting pregnancy 03/11/2017  . Headache 03/11/2017  . Neck pain 03/11/2017  . SVD (spontaneous  vaginal delivery) 12/22/2015  . Postpartum care following vaginal delivery 12/22/2015  . Indication for care in labor and delivery, antepartum 12/21/2015    Past Surgical History:  Procedure Laterality Date  . OOPHORECTOMY     left  . OVARIAN CYST REMOVAL    . WISDOM TOOTH EXTRACTION       OB History    Gravida  3   Para  2   Term  2   Preterm      AB  1   Living  2     SAB  1   IAB      Ectopic      Multiple  0   Live Births  2           Family History  Adopted: Yes  Problem Relation Age of Onset  . COPD Mother   . Hypertension Father     Social History   Tobacco Use  . Smoking status: Never Smoker  . Smokeless tobacco: Never Used  Vaping Use  . Vaping Use: Never used  Substance Use Topics  . Alcohol use: Yes  . Drug use: No    Home Medications Prior to Admission medications   Medication Sig Start Date End Date Taking? Authorizing Provider  albuterol (PROVENTIL HFA;VENTOLIN HFA) 108 (90 Base) MCG/ACT inhaler Inhale 2 puffs into the lungs every 4 (four) hours as needed for wheezing or shortness of breath.     [provider]  Levonorgestrel-Ethinyl Estradiol (AMETHIA,CAMRESE)  0.15-0.03 &0.01 MG tablet Take 1 tablet by mouth daily. 12/08/17   [provider]  lidocaine (XYLOCAINE) 2 % solution Use as directed 15 mLs in the mouth or throat as needed for mouth pain. 09/23/19   Walisiewicz, Harley Hallmark, PA-C  methocarbamol (ROBAXIN) 500 MG tablet Take 1 tablet (500 mg total) by mouth 2 (two) times daily. Patient not taking: Reported on 05/24/2020 04/11/20   Henderly, Britni A, PA-C  naproxen (NAPROSYN) 500 MG tablet Take 1 tablet (500 mg total) by mouth 2 (two) times daily. Patient not taking: Reported on 05/24/2020 04/11/20   Henderly, Britni A, PA-C  ondansetron (ZOFRAN ODT) 4 MG disintegrating tablet Take 1 tablet (4 mg total) by mouth every 8 (eight) hours as needed for nausea or vomiting. Patient not taking: Reported on 09/23/2019  09/10/19   Sherwood Gambler, MD  ondansetron (ZOFRAN) 4 MG tablet Take 1 tablet (4 mg total) by mouth every 6 (six) hours. Patient not taking: Reported on 09/23/2019 10/19/18   Valarie Merino, MD  propranolol (INDERAL) 20 MG tablet One tablet twice a day for 2 weeks, then take 2 twice a day Patient not taking: Reported on 09/23/2019 12/21/18   Kathrynn Ducking, MD  rizatriptan (MAXALT) 10 MG tablet Take 1 tablet (10 mg total) by mouth 3 (three) times daily as needed for migraine. Patient not taking: Reported on 09/23/2019 12/21/18   Kathrynn Ducking, MD    Allergies    Shellfish allergy, Codeine, Contrast media [iodinated diagnostic agents], and Other  Review of Systems   Review of Systems  Constitutional: Positive for chills and fever.  HENT: Positive for congestion, ear pain, rhinorrhea and sore throat.   Respiratory: Positive for cough and shortness of breath.   Cardiovascular: Negative for chest pain and leg swelling.  Gastrointestinal: Positive for diarrhea. Negative for abdominal pain, blood in stool and constipation.  Genitourinary: Negative for dysuria.  Neurological: Negative for syncope.  All other systems reviewed and are negative.   Physical Exam Updated Vital Signs BP (!) 151/117 (BP Location: Left Arm)   Pulse 98   Temp 99.8 F (37.7 C) (Oral)   Resp 20   LMP 02/13/2021   SpO2 96%   Physical Exam Vitals and nursing note reviewed.  Constitutional:      General: She is not in acute distress.    Appearance: She is well-developed.  HENT:     Head: Normocephalic and atraumatic.     Right Ear: Ear canal normal. Tympanic membrane is not perforated, erythematous, retracted or bulging.     Left Ear: Ear canal normal. Tympanic membrane is not perforated, erythematous, retracted or bulging.     Ears:     Comments: No mastoid erythema/swelling/tenderness.     Nose:     Right Sinus: No maxillary sinus tenderness or frontal sinus tenderness.     Left Sinus: No maxillary  sinus tenderness or frontal sinus tenderness.     Mouth/Throat:     Pharynx: Uvula midline. Posterior oropharyngeal erythema present. No oropharyngeal exudate.     Comments: Posterior oropharynx is symmetric appearing. Patient tolerating own secretions without difficulty. No trismus. No drooling. No hot potato voice. No swelling beneath the tongue, submandibular compartment is soft.  Eyes:     General:        Right eye: No discharge.        Left eye: No discharge.     Conjunctiva/sclera: Conjunctivae normal.     Pupils: Pupils are equal, round, and reactive  to light.  Cardiovascular:     Rate and Rhythm: Normal rate and regular rhythm.     Heart sounds: No murmur heard.   Pulmonary:     Effort: Pulmonary effort is normal. No respiratory distress.     Breath sounds: Normal breath sounds. No wheezing, rhonchi or rales.  Abdominal:     General: There is no distension.     Palpations: Abdomen is soft.     Tenderness: There is no abdominal tenderness.  Musculoskeletal:     Cervical back: Normal range of motion and neck supple. No edema or rigidity.  Lymphadenopathy:     Cervical: No cervical adenopathy.  Skin:    General: Skin is warm and dry.     Findings: No rash.  Neurological:     Mental Status: She is alert.  Psychiatric:        Behavior: Behavior normal.     ED Results / Procedures / Treatments   Labs (all labs ordered are listed, but only abnormal results are displayed) Labs Reviewed  RESP PANEL BY RT-PCR (FLU A&B, COVID) [id number]  GROUP A STREP BY PCR    EKG None  Radiology DG Chest Portable 1 View  Result Date: 02/14/2021 CLINICAL DATA:  Cough, weakness and shortness of breath and diarrhea. EXAM: PORTABLE CHEST 1 VIEW COMPARISON:  May 24, 2020 FINDINGS: Trachea midline. Cardiomediastinal contours and hilar structures are normal. Lungs are clear.  No sign of effusion. On limited assessment no acute skeletal process. IMPRESSION: No acute cardiopulmonary disease.  Electronically Signed   By: Zetta Bills M.D.   On: 02/14/2021 09:30    Procedures Procedures   Medications Ordered in ED Medications  alum & mag hydroxide-simeth (MAALOX/MYLANTA) 200-200-20 MG/5ML suspension 30 mL (30 mLs Oral Given 02/14/21 0858)    And  lidocaine (XYLOCAINE) 2 % viscous mouth solution 15 mL (15 mLs Oral Given 02/14/21 0858)  acetaminophen (TYLENOL) tablet 650 mg (650 mg Oral Given 02/14/21 9563)    ED Course  I have reviewed the triage vital signs and the nursing notes.  Pertinent labs & imaging results that were available during my care of the patient were reviewed by me and considered in my medical decision making (see chart for details).  Lanore Renderos Schall was evaluated in Emergency Department on 02/14/2021 for the symptoms described in the history of present illness. He/she was evaluated in the context of the global COVID-19 pandemic, which necessitated consideration that the patient might be at risk for infection with the SARS-CoV-2 virus that causes COVID-19. Institutional protocols and algorithms that pertain to the evaluation of patients at risk for COVID-19 are in a state of rapid change based on information released by regulatory bodies including the CDC and federal and state organizations. These policies and algorithms were followed during the patient's care in the ED.    MDM Rules/Calculators/A&P                         Patient presents to the ED with complaints of flulike symptoms overall over the past 1 week.  Patient is nontoxic, resting comfortably, BP elevated, doubt hypertensive emergency.  Additional history obtained:  Additional history obtained from chart review & nursing note review.   Lab Tests:  I Ordered, reviewed, and interpreted labs, which included:  Influenza/COVID/strep testing: Negative  Imaging Studies ordered:  I ordered imaging studies which included chest x-ray, I independently reviewed, formal radiology impression shows: No acute  cardiopulmonary disease.  ED  Course:  Patient is afebrile in the emergency department, she has no sinus tenderness, do not suspect acute bacterial sinusitis, symptoms less than 10 days at this time.  She has no signs of AOM, AOE, or mastoiditis on exam.  posteriororopharynx with some erythema, strep test negative, exam does not appear consistent with RPA/PTA.  No meningismus. Chest x-ray without infiltrate to suggest pneumonia.  Lungs are clear to auscultation bilaterally, there is no active wheezing, patient does not appear in respiratory distress, does not seem consistent with acute asthma exacerbation.  Patient is low risk Wells, I have a low suspicion for pulmonary embolism.  Abdomen is nontender without peritoneal signs.  Overall suspicion is for viral process, possibly allergic but felt to be less likely, will treat supportively, PCP follow-up. I discussed results, treatment plan, need for follow-up, and return precautions with the patient. Provided opportunity for questions, patient confirmed understanding and is in agreement with plan.   Portions of this note were generated with [employer]. Dictation errors may occur despite best attempts at proofreading.  Final Clinical Impression(s) / ED Diagnoses Final diagnoses:  Influenza-like illness    Rx / DC Orders ED Discharge Orders         Ordered    fluticasone (FLONASE) 50 MCG/ACT nasal spray  Daily        02/14/21 1013    naproxen (NAPROSYN) 500 MG tablet  2 times daily PRN        02/14/21 1013    benzonatate (TESSALON) 100 MG capsule  Every 8 hours        02/14/21 1013           Nyaisha Simao, Kangley R, PA-C 02/14/21 1013    Lucrezia Starch, MD 02/15/21 0840

## 2021-02-14 NOTE — Discharge Instructions (Addendum)
You were seen in the emergency today for upper/lower respiratory symptoms, we suspect your symptoms are related to allergies or a virus at this time.  Your flu, covid, and strep test were negative. Your chest xray did not show pneumonia. We have prescribed you multiple medications to treat your symptoms.   -Flonase to be used 1 spray in each nostril daily.  This medication is used to treat your congestion.  -Tessalon can be taken once every 8 hours as needed.  This medication is used to treat your cough.  -Naproxen to be taken once every 12 hours as needed for pain. Please take this medicine with food as it can cause stomach upset and at worst stomach bleeding. Do not take other NSAIDs such as motrin, aleve, advil, naprosyn, ibuprofen, mobic, etc as they are similar. You make take tylenol per over the counter dosing with this medicine safely.  We have prescribed you new medication(s) today. Discuss the medications prescribed today with your pharmacist as they can have adverse effects and interactions with your other medicines including over the counter and prescribed medications. Seek medical evaluation if you start to experience new or abnormal symptoms after taking one of these medicines, seek care immediately if you start to experience difficulty breathing, feeling of your throat closing, facial swelling, or rash as these could be indications of a more serious allergic reaction  Your blood pressure was elevated in the emergency department today, please be sure to have this rechecked by primary care provider within 1 week.  You will need to follow-up with your primary care provider in 1 week if your symptoms have not improved.  If you do not have a primary care provider one is provided in your discharge instructions.  Return to the emergency department for any new or worsening symptoms including but not limited to persistent fever for 5 days, difficulty breathing, chest pain, rashes, passing out, or  any other concerns.

## 2021-02-14 NOTE — ED Triage Notes (Signed)
Pt arrived via walk in, c/o sore throat, fevers, chills, body aches, diarrhea x1 week. States daughter was recently sick with same. States at home covid test last night, negative.

## 2021-02-14 NOTE — ED Notes (Signed)
X-ray at bedside

## 2021-02-16 ENCOUNTER — Other Ambulatory Visit: Payer: Self-pay

## 2021-02-16 ENCOUNTER — Encounter (HOSPITAL_COMMUNITY): Payer: Self-pay | Admitting: Emergency Medicine

## 2021-02-16 ENCOUNTER — Emergency Department (HOSPITAL_COMMUNITY)
Admission: EM | Admit: 2021-02-16 | Discharge: 2021-02-16 | Disposition: A | Discharge status: Home / Self Care | Payer: Medicaid Other | Attending: Emergency Medicine | Admitting: Emergency Medicine

## 2021-02-16 ENCOUNTER — Emergency Department (HOSPITAL_COMMUNITY): Payer: Medicaid Other

## 2021-02-16 DIAGNOSIS — J028 Acute pharyngitis due to other specified organisms: Secondary | ICD-10-CM | POA: Diagnosis not present

## 2021-02-16 DIAGNOSIS — J029 Acute pharyngitis, unspecified: Secondary | ICD-10-CM | POA: Diagnosis present

## 2021-02-16 DIAGNOSIS — B9711 Coxsackievirus as the cause of diseases classified elsewhere: Secondary | ICD-10-CM | POA: Insufficient documentation

## 2021-02-16 DIAGNOSIS — B085 Enteroviral vesicular pharyngitis: Secondary | ICD-10-CM

## 2021-02-16 LAB — MONONUCLEOSIS SCREEN: Mono Screen: NEGATIVE

## 2021-02-16 MED ORDER — LIDOCAINE VISCOUS HCL 2 % MT SOLN
15.0000 mL | Freq: Once | OROMUCOSAL | Status: AC
  Administered 2021-02-16: 15 mL via OROMUCOSAL
  Filled 2021-02-16: qty 15

## 2021-02-16 MED ORDER — ACETAMINOPHEN 500 MG PO TABS
1000.0000 mg | ORAL_TABLET | Freq: Once | ORAL | Status: AC
  Administered 2021-02-16: 1000 mg via ORAL
  Filled 2021-02-16: qty 2

## 2021-02-16 MED ORDER — PREDNISONE 10 MG (21) PO TBPK: ORAL_TABLET | ORAL | 0 refills | Status: AC

## 2021-02-16 MED ORDER — ACETAMINOPHEN 325 MG PO TABS: 650.0000 mg | ORAL_TABLET | Freq: Once | ORAL | Status: DC | PRN

## 2021-02-16 NOTE — ED Triage Notes (Signed)
Pt reports continued sore throat, fever, mouth pain, and hands tingling. Reports that she has been feeling ill for about a week and half. Has tested negative for COVID, strep, and flu. Was seen 2 days ago for same, but is not improving.

## 2021-02-16 NOTE — ED Provider Notes (Signed)
[facility] EMERGENCY DEPARTMENT Provider Note  CSN: [id number] Arrival date & time: 02/16/21 1946    History Chief Complaint  Patient presents with  . Fever  . Sore Throat    HPI  Barbara Haas is a 30 y.o. female with persistent sore throat. Seen in the ED 2 days ago for same, had neg Strep, Covid and Flu swabs. Continues to have fevers and sore throat. She reports her daughter had a 'mouth infection', photos she showed me look like herpangina. She has not had any cough, CP, SOB, N/V/D or dysuria.    Past Medical History:  Diagnosis Date  . Anxiety   . Asthma   . Back pain affecting pregnancy 03/11/2017  . Common migraine with intractable migraine 12/21/2018  . Complication of anesthesia    bp drops  . Endometriosis   . Headache 03/11/2017  . Nausea and vomiting during pregnancy prior to [redacted] weeks gestation 03/11/2017  . Neck pain 03/11/2017  . Sickle cell trait [facility])     Past Surgical History:  Procedure Laterality Date  . OOPHORECTOMY     left  . OVARIAN CYST REMOVAL    . WISDOM TOOTH EXTRACTION      Family History  Adopted: Yes  Problem Relation Age of Onset  . COPD Mother   . Hypertension Father     Social History   Tobacco Use  . Smoking status: Never Smoker  . Smokeless tobacco: Never Used  Vaping Use  . Vaping Use: Never used  Substance Use Topics  . Alcohol use: Yes  . Drug use: No     Home Medications Prior to Admission medications   Medication Sig Start Date End Date Taking? Authorizing Provider  predniSONE (STERAPRED UNI-PAK 21 TAB) 10 MG (21) TBPK tablet 10mg  Tabs, 6 day taper. Use as directed 02/16/21  Yes Truddie Hidden, MD  albuterol (PROVENTIL HFA;VENTOLIN HFA) 108 (90 Base) MCG/ACT inhaler Inhale 2 puffs into the lungs every 4 (four) hours as needed for wheezing or shortness of breath.     [provider]  benzonatate (TESSALON) 100 MG capsule Take 1 capsule (100 mg total) by mouth every 8 (eight) hours. 02/14/21    Petrucelli, Samantha R, PA-C  fluticasone (FLONASE) 50 MCG/ACT nasal spray Place 1 spray into both nostrils daily. 02/14/21   Petrucelli, Samantha R, PA-C  Levonorgestrel-Ethinyl Estradiol (AMETHIA,CAMRESE) 0.15-0.03 &0.01 MG tablet Take 1 tablet by mouth daily. 12/08/17   [provider]  lidocaine (XYLOCAINE) 2 % solution Use as directed 15 mLs in the mouth or throat as needed for mouth pain. 09/23/19   Walisiewicz, Verline Lema E, PA-C  naproxen (NAPROSYN) 500 MG tablet Take 1 tablet (500 mg total) by mouth 2 (two) times daily as needed for moderate pain. 02/14/21   Petrucelli, Glynda Jaeger, PA-C  propranolol (INDERAL) 20 MG tablet One tablet twice a day for 2 weeks, then take 2 twice a day Patient not taking: Reported on 09/23/2019 12/21/18 02/14/21  Kathrynn Ducking, MD  rizatriptan (MAXALT) 10 MG tablet Take 1 tablet (10 mg total) by mouth 3 (three) times daily as needed for migraine. Patient not taking: Reported on 09/23/2019 12/21/18 02/14/21  Kathrynn Ducking, MD     Allergies    Shellfish allergy, Codeine, Contrast media [iodinated diagnostic agents], and Other   Review of Systems   Review of Systems A comprehensive review of systems was completed and negative except as noted in HPI.    Physical Exam BP 106/71  Pulse 87   Temp (!) 101.7 F (38.7 C) (Oral)   Resp 15   Ht 5\' 9"  (1.753 m)   Wt 124.3 kg   LMP 02/13/2021   SpO2 94%   BMI 40.46 kg/m   Physical Exam Vitals and nursing note reviewed.  Constitutional:      Appearance: Normal appearance.  HENT:     Head: Normocephalic and atraumatic.     Nose: Nose normal.     Mouth/Throat:     Mouth: Mucous membranes are moist. Oral lesions (shallow ulcerations on buccal and soft palate areas) present.     Pharynx: Uvula midline. Posterior oropharyngeal erythema present.     Tonsils: Tonsillar exudate present.  Eyes:     Extraocular Movements: Extraocular movements intact.     Conjunctiva/sclera: Conjunctivae normal.   Cardiovascular:     Rate and Rhythm: Normal rate.  Pulmonary:     Effort: Pulmonary effort is normal.     Breath sounds: Normal breath sounds.  Abdominal:     General: Abdomen is flat.     Palpations: Abdomen is soft.     Tenderness: There is no abdominal tenderness.  Musculoskeletal:        General: No swelling. Normal range of motion.     Cervical back: Neck supple.  Lymphadenopathy:     Cervical: No cervical adenopathy.  Skin:    General: Skin is warm and dry.  Neurological:     General: No focal deficit present.     Mental Status: She is alert.  Psychiatric:        Mood and Affect: Mood normal.        ED Results / Procedures / Treatments   Labs (all labs ordered are listed, but only abnormal results are displayed) Labs Reviewed  MONONUCLEOSIS SCREEN    EKG None  Radiology No results found.  Procedures Procedures  Medications Ordered in the ED Medications  acetaminophen (TYLENOL) tablet 1,000 mg (1,000 mg Oral Given 02/16/21 2028)  lidocaine (XYLOCAINE) 2 % viscous mouth solution 15 mL (15 mLs Mouth/Throat Given 02/16/21 2035)     MDM Rules/Calculators/A&P MDM Patient here with continued fever and sore throat. Likely viral, did not have mono tested at recent visit. If negative then I suspect she has coxsackie infection from her daughter. She is interested in referral to ENT for evaluation of her tonsils as they've bothered her since she was a kid and she would like to have them removed.  ED Course  I have reviewed the triage vital signs and the nursing notes.  Pertinent labs & imaging results that were available during my care of the patient were reviewed by me and considered in my medical decision making (see chart for details).  Clinical Course as of 02/16/21 2220  Fri Feb 16, 2021  2118 Note that sepsis workup ordered in Triage during MSE process is not necessary. She has no signs of systemic infection or sepsis in the ED today.  [CS]    Clinical  Course User Index [CS] Truddie Hidden, MD    Final Clinical Impression(s) / ED Diagnoses Final diagnoses:  Acute pharyngitis due to Coxsackie virus    Rx / DC Orders ED Discharge Orders         Ordered    predniSONE (STERAPRED UNI-PAK 21 TAB) 10 MG (21) TBPK tablet        02/16/21 2220           Truddie Hidden, MD 02/16/21 2220

## 2021-02-16 NOTE — ED Provider Notes (Signed)
Emergency Medicine Provider Triage Evaluation Note  Barbara Haas , a 30 y.o. female  was evaluated in triage.  Pt complains of sore throat.  Review of Systems  Positive: Fever, headache, sore throat, sores in mouth, fatigue Negative: Cp, sob, abd pain, dysuria  Physical Exam  BP 138/85 (BP Location: Left Arm)   Pulse 98   Temp (!) 101.7 F (38.7 C) (Oral)   Resp 20   Ht 5\' 9"  (1.753 m)   Wt 124.3 kg   LMP 02/13/2021   SpO2 95%   BMI 40.46 kg/m  Gen:   Awake, appears fatigue Resp:  Normal effort  MSK:   Moves extremities without difficulty  Other:  Multiple shallow ulcerations noted in mouth including hard palate, and lower lip. Throat otherwise unremarkable  Medical Decision Making  Medically screening exam initiated at 8:18 PM.  Appropriate orders placed.  Barbara Haas was informed that the remainder of the evaluation will be completed by another provider, this initial triage assessment does not replace that evaluation, and the importance of remaining in the ED until their evaluation is complete.  Pt with sore throat, fever, fatigue and sores in her mouth for nearly 2 weeks.  Last seen in the ER 2 days ago.  Viral resp panel and strep test are negative.  sxs persists.  Has fever of 101.7 here.  No nuchal ridigity   Domenic Moras, PA-C 02/16/21 2021    Sherwood Gambler, MD 02/16/21 [phone]

## 2021-02-17 ENCOUNTER — Ambulatory Visit (HOSPITAL_COMMUNITY): Admission: RE | Admit: 2021-02-17 | Payer: No Typology Code available for payment source | Source: Ambulatory Visit

## 2021-02-24 ENCOUNTER — Other Ambulatory Visit: Payer: Self-pay

## 2021-02-24 ENCOUNTER — Encounter (HOSPITAL_COMMUNITY): Payer: Self-pay

## 2021-02-24 ENCOUNTER — Emergency Department (HOSPITAL_COMMUNITY)
Admission: EM | Admit: 2021-02-24 | Discharge: 2021-02-24 | Disposition: A | Discharge status: Home / Self Care | Payer: Medicaid Other | Attending: Emergency Medicine | Admitting: Emergency Medicine

## 2021-02-24 DIAGNOSIS — Z23 Encounter for immunization: Secondary | ICD-10-CM | POA: Insufficient documentation

## 2021-02-24 DIAGNOSIS — R059 Cough, unspecified: Secondary | ICD-10-CM | POA: Diagnosis not present

## 2021-02-24 DIAGNOSIS — Z7952 Long term (current) use of systemic steroids: Secondary | ICD-10-CM | POA: Insufficient documentation

## 2021-02-24 DIAGNOSIS — J45909 Unspecified asthma, uncomplicated: Secondary | ICD-10-CM | POA: Insufficient documentation

## 2021-02-24 DIAGNOSIS — L02214 Cutaneous abscess of groin: Secondary | ICD-10-CM | POA: Diagnosis present

## 2021-02-24 DIAGNOSIS — L0291 Cutaneous abscess, unspecified: Secondary | ICD-10-CM

## 2021-02-24 MED ORDER — TETANUS-DIPHTH-ACELL PERTUSSIS 5-2.5-18.5 LF-MCG/0.5 IM SUSY
0.5000 mL | PREFILLED_SYRINGE | Freq: Once | INTRAMUSCULAR | Status: AC
  Administered 2021-02-24: 0.5 mL via INTRAMUSCULAR
  Filled 2021-02-24: qty 0.5

## 2021-02-24 MED ORDER — LIDOCAINE-EPINEPHRINE (PF) 2 %-1:200000 IJ SOLN
20.0000 mL | Freq: Once | INTRAMUSCULAR | Status: AC
  Administered 2021-02-24: 20 mL
  Filled 2021-02-24: qty 20

## 2021-02-24 MED ORDER — FLUTICASONE PROPIONATE (INHAL) 100 MCG/BLIST IN AEPB: 1.0000 | INHALATION_SPRAY | Freq: Every day | RESPIRATORY_TRACT | 0 refills | Status: AC

## 2021-02-24 NOTE — Discharge Instructions (Signed)
Please keep your area that was drained today clean and dry.  You may use warm compress or warm sitz bath's once daily as well.  Please monitor for signs of infection including worsening redness, swelling, fevers.  You may witness additional drainage over the next few days.  Please have your abscess rechecked in 2 days.  Please use the steroid inhaler once daily.  As discussed, please make sure to follow-up with your primary care doctor if your symptoms continue.

## 2021-02-24 NOTE — ED Triage Notes (Signed)
Patient c/o an abscess to the right groin area x 4 days. Patient reports a continuous cough from last week which she was seen for. Cough is productive with yellow/green and blood-tinged sputum.

## 2021-02-24 NOTE — ED Provider Notes (Addendum)
[facility] DEPT Provider Note   CSN: [id number] Arrival date & time: 02/24/21  [id number]     History Chief Complaint  Patient presents with  . Abscess    Barbara Haas is a 30 y.o. female.  HPI 30 year old female with a history of anxiety, asthma, migraines, headaches presents to the ER with multiple complaints.  Patient complains of an abscess in her right groin which is been ongoing for the last 4 days.  She has a history of abscesses.  Denies any difficulty urinating or moving her bowels.  No fevers or chills.  She also complains of a continuous cough which has been ongoing for about 10 days now.  She was seen here on the sixth with similar complaint.  She was given a course of steroids which she states has been minimally helpful.  Cough is minimally productive.  No hemoptysis.  She does report a history of asthma, has been using inhaler as well as over-the-counter cough medicines.  She has also tried [employer] with little relief.  She states that she went to [place] yesterday and had a chest x-ray and a long wait times.  Denies any chest pain, leg swelling, shortness of breath.    Past Medical History:  Diagnosis Date  . Anxiety   . Asthma   . Back pain affecting pregnancy 03/11/2017  . Common migraine with intractable migraine 12/21/2018  . Complication of anesthesia    bp drops  . Endometriosis   . Headache 03/11/2017  . Nausea and vomiting during pregnancy prior to [redacted] weeks gestation 03/11/2017  . Neck pain 03/11/2017  . Sickle cell trait [facility])     Patient Active Problem List   Diagnosis Date Noted  . Common migraine with intractable migraine 12/21/2018  . Indication for care in labor or delivery 10/09/2017  . Spontaneous vaginal delivery 10/09/2017  . Nausea and vomiting during pregnancy prior to [redacted] weeks gestation 03/11/2017  . Back pain affecting pregnancy 03/11/2017  . Headache 03/11/2017  . Neck pain 03/11/2017  . SVD  (spontaneous vaginal delivery) 12/22/2015  . Postpartum care following vaginal delivery 12/22/2015  . Indication for care in labor and delivery, antepartum 12/21/2015    Past Surgical History:  Procedure Laterality Date  . OOPHORECTOMY     left  . OVARIAN CYST REMOVAL    . WISDOM TOOTH EXTRACTION       OB History    Gravida  3   Para  2   Term  2   Preterm      AB  1   Living  2     SAB  1   IAB      Ectopic      Multiple  0   Live Births  2           Family History  Adopted: Yes  Problem Relation Age of Onset  . COPD Mother   . Hypertension Father     Social History   Tobacco Use  . Smoking status: Never Smoker  . Smokeless tobacco: Never Used  Vaping Use  . Vaping Use: Never used  Substance Use Topics  . Alcohol use: Yes  . Drug use: No    Home Medications Prior to Admission medications   Medication Sig Start Date End Date Taking? Authorizing Provider  Fluticasone Propionate, Inhal, 100 MCG/BLIST AEPB Inhale 1 applicator into the lungs daily. 02/24/21  Yes Sharyn Lull A, PA-C  albuterol (PROVENTIL HFA;VENTOLIN HFA) 108 (  90 Base) MCG/ACT inhaler Inhale 2 puffs into the lungs every 4 (four) hours as needed for wheezing or shortness of breath.     [provider]  benzonatate (TESSALON) 100 MG capsule Take 1 capsule (100 mg total) by mouth every 8 (eight) hours. 02/14/21   Petrucelli, Samantha R, PA-C  fluticasone (FLONASE) 50 MCG/ACT nasal spray Place 1 spray into both nostrils daily. 02/14/21   Petrucelli, Samantha R, PA-C  Levonorgestrel-Ethinyl Estradiol (AMETHIA,CAMRESE) 0.15-0.03 &0.01 MG tablet Take 1 tablet by mouth daily. 12/08/17   [provider]  lidocaine (XYLOCAINE) 2 % solution Use as directed 15 mLs in the mouth or throat as needed for mouth pain. 09/23/19   Walisiewicz, Verline Lema E, PA-C  naproxen (NAPROSYN) 500 MG tablet Take 1 tablet (500 mg total) by mouth 2 (two) times daily as needed for moderate pain. 02/14/21    Petrucelli, Samantha R, PA-C  predniSONE (STERAPRED UNI-PAK 21 TAB) 10 MG (21) TBPK tablet 10mg  Tabs, 6 day taper. Use as directed 02/16/21   Truddie Hidden, MD  propranolol (INDERAL) 20 MG tablet One tablet twice a day for 2 weeks, then take 2 twice a day Patient not taking: Reported on 09/23/2019 12/21/18 02/14/21  Kathrynn Ducking, MD  rizatriptan (MAXALT) 10 MG tablet Take 1 tablet (10 mg total) by mouth 3 (three) times daily as needed for migraine. Patient not taking: Reported on 09/23/2019 12/21/18 02/14/21  Kathrynn Ducking, MD    Allergies    Shellfish allergy, Codeine, Contrast media [iodinated diagnostic agents], and Other  Review of Systems   Review of Systems  Constitutional: Negative for chills and fever.  Respiratory: Positive for cough. Negative for shortness of breath.   Cardiovascular: Negative for chest pain, palpitations and leg swelling.  Skin: Positive for color change. Negative for rash.  All other systems reviewed and are negative.   Physical Exam Updated Vital Signs BP (!) 145/95 (BP Location: Left Arm)   Pulse 90   Temp 98.1 F (36.7 C) (Oral)   Resp 16   Ht 5\' 9"  (1.753 m)   Wt 122.5 kg   LMP 02/13/2021   SpO2 99%   BMI 39.87 kg/m   Physical Exam Vitals and nursing note reviewed.  Constitutional:      General: She is not in acute distress.    Appearance: She is well-developed.  HENT:     Head: Normocephalic and atraumatic.  Eyes:     Conjunctiva/sclera: Conjunctivae normal.  Cardiovascular:     Rate and Rhythm: Normal rate and regular rhythm.     Pulses: Normal pulses.     Heart sounds: No murmur heard.   Pulmonary:     Effort: Pulmonary effort is normal. No respiratory distress.     Breath sounds: Normal breath sounds.  Abdominal:     Palpations: Abdomen is soft.     Tenderness: There is no abdominal tenderness.  Genitourinary:    Comments: 3 to 4 cm fluctuant abscess to the right groin.  No significant surrounding  erythema. Musculoskeletal:     Cervical back: Neck supple.  Skin:    General: Skin is warm and dry.  Neurological:     General: No focal deficit present.     Mental Status: She is alert and oriented to person, place, and time.  Psychiatric:        Mood and Affect: Mood normal.     ED Results / Procedures / Treatments   Labs (all labs ordered are listed, but only  abnormal results are displayed) Labs Reviewed - No data to display  EKG None  Radiology No results found.  Procedures .Marland KitchenIncision and Drainage  Date/Time: 02/25/2021 4:38 PM Performed by: Garald Balding, PA-C Authorized by: Garald Balding, PA-C   Consent:    Consent obtained:  Verbal   Consent given by:  Patient   Risks discussed:  Bleeding, incomplete drainage, pain and damage to other organs   Alternatives discussed:  No treatment Universal protocol:    Procedure explained and questions answered to patient or proxy's satisfaction: yes     Relevant documents present and verified: yes     Test results available : yes     Imaging studies available: yes     Required blood products, implants, devices, and special equipment available: yes     Site/side marked: yes     Immediately prior to procedure, a time out was called: yes     Patient identity confirmed:  Verbally with patient Location:    Type:  Abscess   Location:  Anogenital   Anogenital location: right groin. Pre-procedure details:    Skin preparation:  Betadine Anesthesia:    Anesthesia method:  Local infiltration   Local anesthetic:  Lidocaine 1% WITH epi Procedure type:    Complexity:  Complex Procedure details:    Incision types:  Single straight   Incision depth:  Subcutaneous   Wound management:  Probed and deloculated, irrigated with saline and extensive cleaning   Drainage:  Purulent   Drainage amount:  Moderate   Packing materials:  None Post-procedure details:    Procedure completion:  Tolerated well, no immediate complications      Medications Ordered in ED Medications  lidocaine-EPINEPHrine (XYLOCAINE W/EPI) 2 %-1:200000 (PF) injection 20 mL (20 mLs Infiltration Given 02/24/21 0944)  Tdap (BOOSTRIX) injection 0.5 mL (0.5 mLs Intramuscular Given 02/24/21 1018)    ED Course  I have reviewed the triage vital signs and the nursing notes.  Pertinent labs & imaging results that were available during my care of the patient were reviewed by me and considered in my medical decision making (see chart for details).    MDM Rules/Calculators/A&P                          30 year old female who presents to the ER with complaints of an abscess to the right groin.  Abscess is amenable to drainage.  No systemic symptoms, no fevers, chills, significant signs of infection.  She also complains of a chronic cough which is been ongoing for about 10 to 12 days.  She was given a course of steroids, has been taking her albuterol inhaler and with little relief.  Lung sounds are clear.  I reviewed her chest x-ray from [place] yesterday, which does not show any signs of infection.  Will prescribe Flovent as I do think that this may be an asthma exacerbation.  No signs of respiratory distress.  I did stress that she needs to follow-up with her PCP if this continues.  Abscess was drained with no complications.  Patient wants to forego packing.  She was instructed to keep the area clean and dry, do warm compress or sitz bath's.  No indication for antibiotics at this time.  Instructed to have her abscess rechecked in 2 days.  We discussed return precautions.  She was understanding and is agreeable.  Stable for discharge Final Clinical Impression(s) / ED Diagnoses Final diagnoses:  Cough  Abscess  Rx / DC Orders ED Discharge Orders         Ordered    Fluticasone Propionate, Inhal, 100 MCG/BLIST AEPB  Daily        02/24/21 [place], Imani Fiebelkorn A, PA-C 02/24/21 1024    Little, Wenda Overland, MD 02/25/21 1510    Garald Balding, PA-C 02/25/21 1638    Little, Wenda Overland, MD 02/28/21 1601

## 2021-03-02 ENCOUNTER — Ambulatory Visit
Admission: RE | Admit: 2021-03-02 | Discharge: 2021-03-02 | Disposition: A | Discharge status: Home / Self Care | Payer: Medicaid Other | Source: Ambulatory Visit | Attending: Family Medicine | Admitting: Family Medicine

## 2021-03-02 ENCOUNTER — Ambulatory Visit: Payer: Medicaid Other

## 2021-03-02 ENCOUNTER — Other Ambulatory Visit: Payer: Self-pay

## 2021-03-02 DIAGNOSIS — N644 Mastodynia: Secondary | ICD-10-CM
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
## 2021-06-05 ENCOUNTER — Emergency Department (HOSPITAL_COMMUNITY)
Admission: EM | Admit: 2021-06-05 | Discharge: 2021-06-05 | Disposition: A | Discharge status: Home / Self Care | Payer: Medicaid Other | Attending: Emergency Medicine | Admitting: Emergency Medicine

## 2021-06-05 ENCOUNTER — Encounter (HOSPITAL_COMMUNITY): Payer: Self-pay

## 2021-06-05 ENCOUNTER — Other Ambulatory Visit: Payer: Self-pay

## 2021-06-05 ENCOUNTER — Emergency Department (HOSPITAL_COMMUNITY): Payer: Medicaid Other

## 2021-06-05 DIAGNOSIS — R509 Fever, unspecified: Secondary | ICD-10-CM | POA: Diagnosis present

## 2021-06-05 DIAGNOSIS — J45909 Unspecified asthma, uncomplicated: Secondary | ICD-10-CM | POA: Diagnosis not present

## 2021-06-05 DIAGNOSIS — D1721 Benign lipomatous neoplasm of skin and subcutaneous tissue of right arm: Secondary | ICD-10-CM | POA: Diagnosis not present

## 2021-06-05 DIAGNOSIS — U071 COVID-19: Secondary | ICD-10-CM | POA: Diagnosis not present

## 2021-06-05 DIAGNOSIS — B349 Viral infection, unspecified: Secondary | ICD-10-CM

## 2021-06-05 DIAGNOSIS — Z7951 Long term (current) use of inhaled steroids: Secondary | ICD-10-CM | POA: Diagnosis not present

## 2021-06-05 DIAGNOSIS — R300 Dysuria: Secondary | ICD-10-CM | POA: Insufficient documentation

## 2021-06-05 LAB — CBC WITH DIFFERENTIAL/PLATELET
Abs Immature Granulocytes: 0.01 10*3/uL (ref 0.00–0.07)
Basophils Absolute: 0 10*3/uL (ref 0.0–0.1)
Basophils Relative: 0 %
Eosinophils Absolute: 0 10*3/uL (ref 0.0–0.5)
Eosinophils Relative: 1 %
HCT: 37 % (ref 36.0–46.0)
Hemoglobin: 12.1 g/dL (ref 12.0–15.0)
Immature Granulocytes: 0 %
Lymphocytes Relative: 14 %
Lymphs Abs: 1 10*3/uL (ref 0.7–4.0)
MCH: 27.2 pg (ref 26.0–34.0)
MCHC: 32.7 g/dL (ref 30.0–36.0)
MCV: 83.1 fL (ref 80.0–100.0)
Monocytes Absolute: 0.5 10*3/uL (ref 0.1–1.0)
Monocytes Relative: 7 %
Neutro Abs: 5.4 10*3/uL (ref 1.7–7.7)
Neutrophils Relative %: 78 %
Platelets: 241 10*3/uL (ref 150–400)
RBC: 4.45 MIL/uL (ref 3.87–5.11)
RDW: 14.3 % (ref 11.5–15.5)
WBC: 7 10*3/uL (ref 4.0–10.5)
nRBC: 0 % (ref 0.0–0.2)

## 2021-06-05 LAB — I-STAT BETA HCG BLOOD, ED (MC, WL, AP ONLY): I-stat hCG, quantitative: <5 m[IU]/mL (ref <5)

## 2021-06-05 LAB — URINALYSIS, ROUTINE W REFLEX MICROSCOPIC
Bilirubin Urine: NEGATIVE
Glucose, UA: NEGATIVE mg/dL
Hgb urine dipstick: NEGATIVE
Ketones, ur: NEGATIVE mg/dL
Leukocytes,Ua: NEGATIVE
Nitrite: NEGATIVE
Protein, ur: NEGATIVE mg/dL
Specific Gravity, Urine: 1.011 (ref 1.005–1.030)
pH: 5 (ref 5.0–8.0)

## 2021-06-05 LAB — COMPREHENSIVE METABOLIC PANEL
ALT: 11 U/L (ref 0–44)
AST: 16 U/L (ref 15–41)
Albumin: 4 g/dL (ref 3.5–5.0)
Alkaline Phosphatase: 63 U/L (ref 38–126)
Anion gap: 7 (ref 5–15)
BUN: 11 mg/dL (ref 6–20)
CO2: 29 mmol/L (ref 22–32)
Calcium: 8.8 mg/dL — ABNORMAL LOW (ref 8.9–10.3)
Chloride: 99 mmol/L (ref 98–111)
Creatinine, Ser: 0.94 mg/dL (ref 0.44–1.00)
GFR, Estimated: >60 mL/min (ref >60)
Glucose, Bld: 85 mg/dL (ref 70–99)
Potassium: 3.5 mmol/L (ref 3.5–5.1)
Sodium: 135 mmol/L (ref 135–145)
Total Bilirubin: 0.6 mg/dL (ref 0.3–1.2)
Total Protein: 8.2 g/dL — ABNORMAL HIGH (ref 6.5–8.1)

## 2021-06-05 LAB — RESP PANEL BY RT-PCR (FLU A&B, COVID) ARPGX2
Influenza A by PCR: NEGATIVE
Influenza B by PCR: NEGATIVE
SARS Coronavirus 2 by RT PCR: POSITIVE — AB

## 2021-06-05 MED ORDER — ACETAMINOPHEN 325 MG PO TABS
650.0000 mg | ORAL_TABLET | Freq: Once | ORAL | Status: AC | PRN
  Administered 2021-06-05: 650 mg via ORAL
  Filled 2021-06-05: qty 2

## 2021-06-05 NOTE — ED Provider Notes (Signed)
Emergency Medicine Provider Triage Evaluation Note  Barbara Haas , a 30 y.o. female  was evaluated in triage.  Pt complains of productive of green sputum, abdominal pain, diarrhea, dysuria, myalgias for the last 2 months.  Patient reports she initially had feeling ill 2 months ago with a cough and green sputum, but her symptoms of not improved and have worsened recently.  Today she is reporting that she feels feverish, has some burning chest discomfort associated with her cough.  She denies exertional chest pain, shortness of breath.  The dysuria began about 2 weeks ago she denies vaginal discharge, odor.  She has not tested herself for COVID, and denies any known exposures.  Review of Systems  Positive: Cough, dysuria, myalgias, diarrhea, fever Negative: Exertional chest pain, shortness of breath  Physical Exam  BP 140/88 (BP Location: Left Arm)   Pulse (!) 104   Temp (!) 100.6 F (38.1 C) (Oral)   Resp 16   Ht '5\' 9"'$  (1.753 m)   Wt 121.8 kg   LMP 05/28/2021   SpO2 98%   BMI 39.65 kg/m  Gen:   Awake, no distress   Resp:  Normal effort, some coarseness in voice MSK:   Moves extremities without difficulty  Other:  Some diffuse tenderness to palpation of abdomen, with soreness in left lower quadrant and suprapubic region.  Medical Decision Making  Medically screening exam initiated at 2:11 PM.  Appropriate orders placed.  Barbara Haas was informed that the remainder of the evaluation will be completed by another provider, this initial triage assessment does not replace that evaluation, and the importance of remaining in the ED until their evaluation is complete.  Cough, dysuria, myalgias, fever   Anselmo Pickler, PA-C 06/05/21 [place], Man, DO 06/05/21 1540

## 2021-06-05 NOTE — ED Provider Notes (Signed)
[facility] EMERGENCY DEPARTMENT Provider Note  CSN: [id number] Arrival date & time: 06/05/21 1225    History Chief Complaint  Patient presents with   Fever    JACQUILINE Haas is a 30 y.o. female with multiple complaints. She has had fever, cough, nasal congestion, sore throat, ongoing for several weeks. Seen by myself and multiple other ED providers several times for same in May, eventually referred to ENT who recommended tonsillectomy. She is saving up for same. She had been improving but worsened again recently.   She also reports some dysuria.   Thirdly she has a lump in her R axilla she thinks may be a boil.    Past Medical History:  Diagnosis Date   Anxiety    Asthma    Back pain affecting pregnancy 03/11/2017   Common migraine with intractable migraine [id number]   Complication of anesthesia    bp drops   Endometriosis    Headache 03/11/2017   Nausea and vomiting during pregnancy prior to [redacted] weeks gestation 03/11/2017   Neck pain 03/11/2017   Sickle cell trait ([place])     Past Surgical History:  Procedure Laterality Date   OOPHORECTOMY     left   OVARIAN CYST REMOVAL     WISDOM TOOTH EXTRACTION      Family History  Adopted: Yes  Problem Relation Age of Onset   COPD Mother    Hypertension Father     Social History   Tobacco Use   Smoking status: Never   Smokeless tobacco: Never  Vaping Use   Vaping Use: Never used  Substance Use Topics   Alcohol use: Yes   Drug use: No     Home Medications Prior to Admission medications   Medication Sig Start Date End Date Taking? Authorizing Provider  albuterol (PROVENTIL HFA;VENTOLIN HFA) 108 (90 Base) MCG/ACT inhaler Inhale 2 puffs into the lungs every 4 (four) hours as needed for wheezing or shortness of breath.     [provider]  benzonatate (TESSALON) 100 MG capsule Take 1 capsule (100 mg total) by mouth every 8 (eight) hours. 02/14/21   Petrucelli, Samantha R, PA-C  fluticasone (FLONASE) 50  MCG/ACT nasal spray Place 1 spray into both nostrils daily. 02/14/21   Petrucelli, Samantha R, PA-C  Fluticasone Propionate, Inhal, 100 MCG/BLIST AEPB Inhale 1 applicator into the lungs daily. 02/24/21   Garald Balding, PA-C  Levonorgestrel-Ethinyl Estradiol (AMETHIA,CAMRESE) 0.15-0.03 &0.01 MG tablet Take 1 tablet by mouth daily. 12/08/17   [provider]  lidocaine (XYLOCAINE) 2 % solution Use as directed 15 mLs in the mouth or throat as needed for mouth pain. 09/23/19   Walisiewicz, Verline Lema E, PA-C  naproxen (NAPROSYN) 500 MG tablet Take 1 tablet (500 mg total) by mouth 2 (two) times daily as needed for moderate pain. 02/14/21   Petrucelli, Samantha R, PA-C  predniSONE (STERAPRED UNI-PAK 21 TAB) 10 MG (21) TBPK tablet '10mg'$  Tabs, 6 day taper. Use as directed 02/16/21   Truddie Hidden, MD  propranolol (INDERAL) 20 MG tablet One tablet twice a day for 2 weeks, then take 2 twice a day Patient not taking: Reported on 09/23/2019 12/21/18 02/14/21  Kathrynn Ducking, MD  rizatriptan (MAXALT) 10 MG tablet Take 1 tablet (10 mg total) by mouth 3 (three) times daily as needed for migraine. Patient not taking: Reported on 09/23/2019 12/21/18 02/14/21  Kathrynn Ducking, MD     Allergies    Shellfish allergy, Shrimp extract allergy skin test, Codeine,  Contrast media [iodinated diagnostic agents], and Other   Review of Systems   Review of Systems A comprehensive review of systems was completed and negative except as noted in HPI.    Physical Exam BP (!) 146/74   Pulse 97   Temp 99.5 F (37.5 C) (Oral)   Resp 18   Ht '5\' 9"'$  (1.753 m)   Wt 121.8 kg   LMP 05/28/2021   SpO2 99%   BMI 39.65 kg/m   Physical Exam Vitals and nursing note reviewed.  Constitutional:      Appearance: Normal appearance.  HENT:     Head: Normocephalic and atraumatic.     Nose: Nose normal.     Mouth/Throat:     Mouth: Mucous membranes are moist.     Pharynx: No posterior oropharyngeal erythema.     Comments:  Normal tonsils Eyes:     Extraocular Movements: Extraocular movements intact.     Conjunctiva/sclera: Conjunctivae normal.  Cardiovascular:     Rate and Rhythm: Normal rate.  Pulmonary:     Effort: Pulmonary effort is normal.     Breath sounds: Normal breath sounds.  Abdominal:     General: Abdomen is flat.     Palpations: Abdomen is soft.     Tenderness: There is no abdominal tenderness.  Musculoskeletal:        General: No swelling. Normal range of motion.     Cervical back: Neck supple.  Skin:    General: Skin is warm and dry.     Comments: Single enlarged lymph node vs lipoma in R axilla, no fluctuance or other signs of infection  Neurological:     General: No focal deficit present.     Mental Status: She is alert.  Psychiatric:        Mood and Affect: Mood normal.     ED Results / Procedures / Treatments   Labs (all labs ordered are listed, but only abnormal results are displayed) Labs Reviewed  COMPREHENSIVE METABOLIC PANEL - Abnormal; Notable for the following components:      Result Value   Calcium 8.8 (*)    Total Protein 8.2 (*)    All other components within normal limits  URINALYSIS, ROUTINE W REFLEX MICROSCOPIC - Abnormal; Notable for the following components:   APPearance HAZY (*)    All other components within normal limits  RESP PANEL BY RT-PCR (FLU A&B, COVID) ARPGX2  CBC WITH DIFFERENTIAL/PLATELET  I-STAT BETA HCG BLOOD, ED ([facility], [facility], [facility] ONLY)    EKG EKG Interpretation  Date/Time:  Tuesday June 05 2021 21:16:19 EDT Ventricular Rate:  95 PR Interval:  149 QRS Duration: 79 QT Interval:  325 QTC Calculation: 409 R Axis:   71 Text Interpretation: Sinus rhythm No significant change since last tracing Confirmed by Calvert Cantor [phone]) on 06/05/2021 9:21:17 PM  Radiology DG Chest Portable 1 View  Result Date: 06/05/2021 CLINICAL DATA:  Green sputum.  Cough x2 months. EXAM: PORTABLE CHEST - 1 VIEW COMPARISON:  Both present sonographically most  recently 02/14/2021. CTA chest, 10/15/2016. FINDINGS: Cardiomediastinal silhouette is within normal limits. Lungs are well inflated. No focal consolidation or mass. No pleural effusion or pneumothorax. No acute osseous abnormality. IMPRESSION: Normal chest. Electronically Signed   By: Michaelle Birks M.D.   On: 06/05/2021 16:00    Procedures Procedures  Medications Ordered in the ED Medications  acetaminophen (TYLENOL) tablet 650 mg (650 mg Oral Given 06/05/21 1419)     MDM Rules/Calculators/A&P MDM Labs and CXR ordered  in triage were normal. She had a Covid test ordered on arrival but never collected. She does not have a UTI, PNA or other signs of serious bacterial infection. She does not want to stay for Covid swab results, advised she can be swabbed and then isolate at home until result comes back tomorrow. Otherwise hs is doing well and safe for discharge home.   ED Course  I have reviewed the triage vital signs and the nursing notes.  Pertinent labs & imaging results that were available during my care of the patient were reviewed by me and considered in my medical decision making (see chart for details).     Final Clinical Impression(s) / ED Diagnoses Final diagnoses:  Viral illness  Lipoma of right upper extremity    Rx / DC Orders ED Discharge Orders     None        Truddie Hidden, MD 06/05/21 2137

## 2021-06-05 NOTE — ED Triage Notes (Signed)
Patient c/o nasal congestion, a productive cough with green sputum x 1 month, sore throat, and body aches.  Patient also c/o dysuria and back spasms.  Patient added that she has an abscess to the right axillary area.

## 2021-06-13 IMAGING — CR DG CHEST 2V
2 series · 2 of 2 positions shown · non-contrast
Comparison: April 11, 2020

CLINICAL DATA: Cough

EXAM:
CHEST - 2 VIEW

[w chest pa]
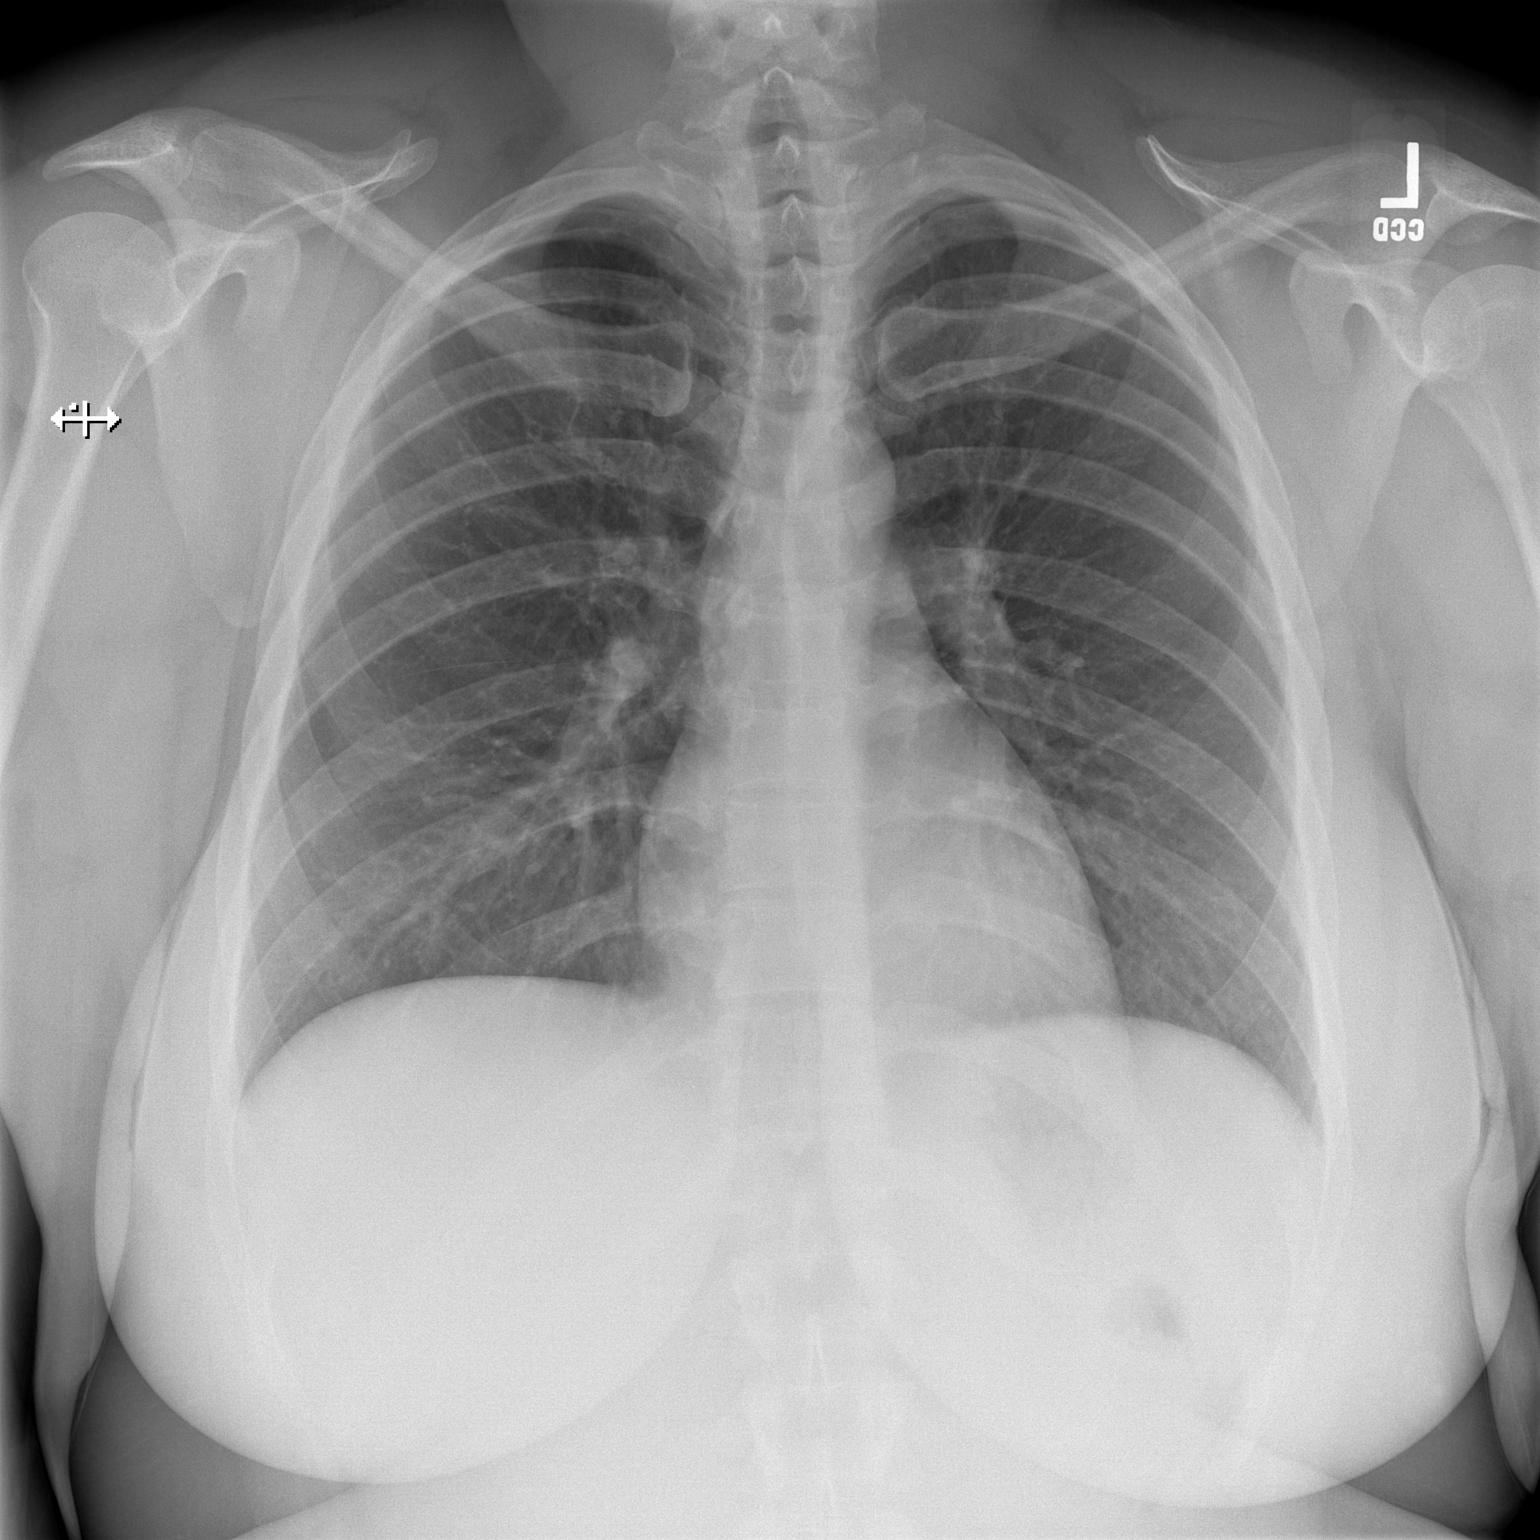

[w chest lat]
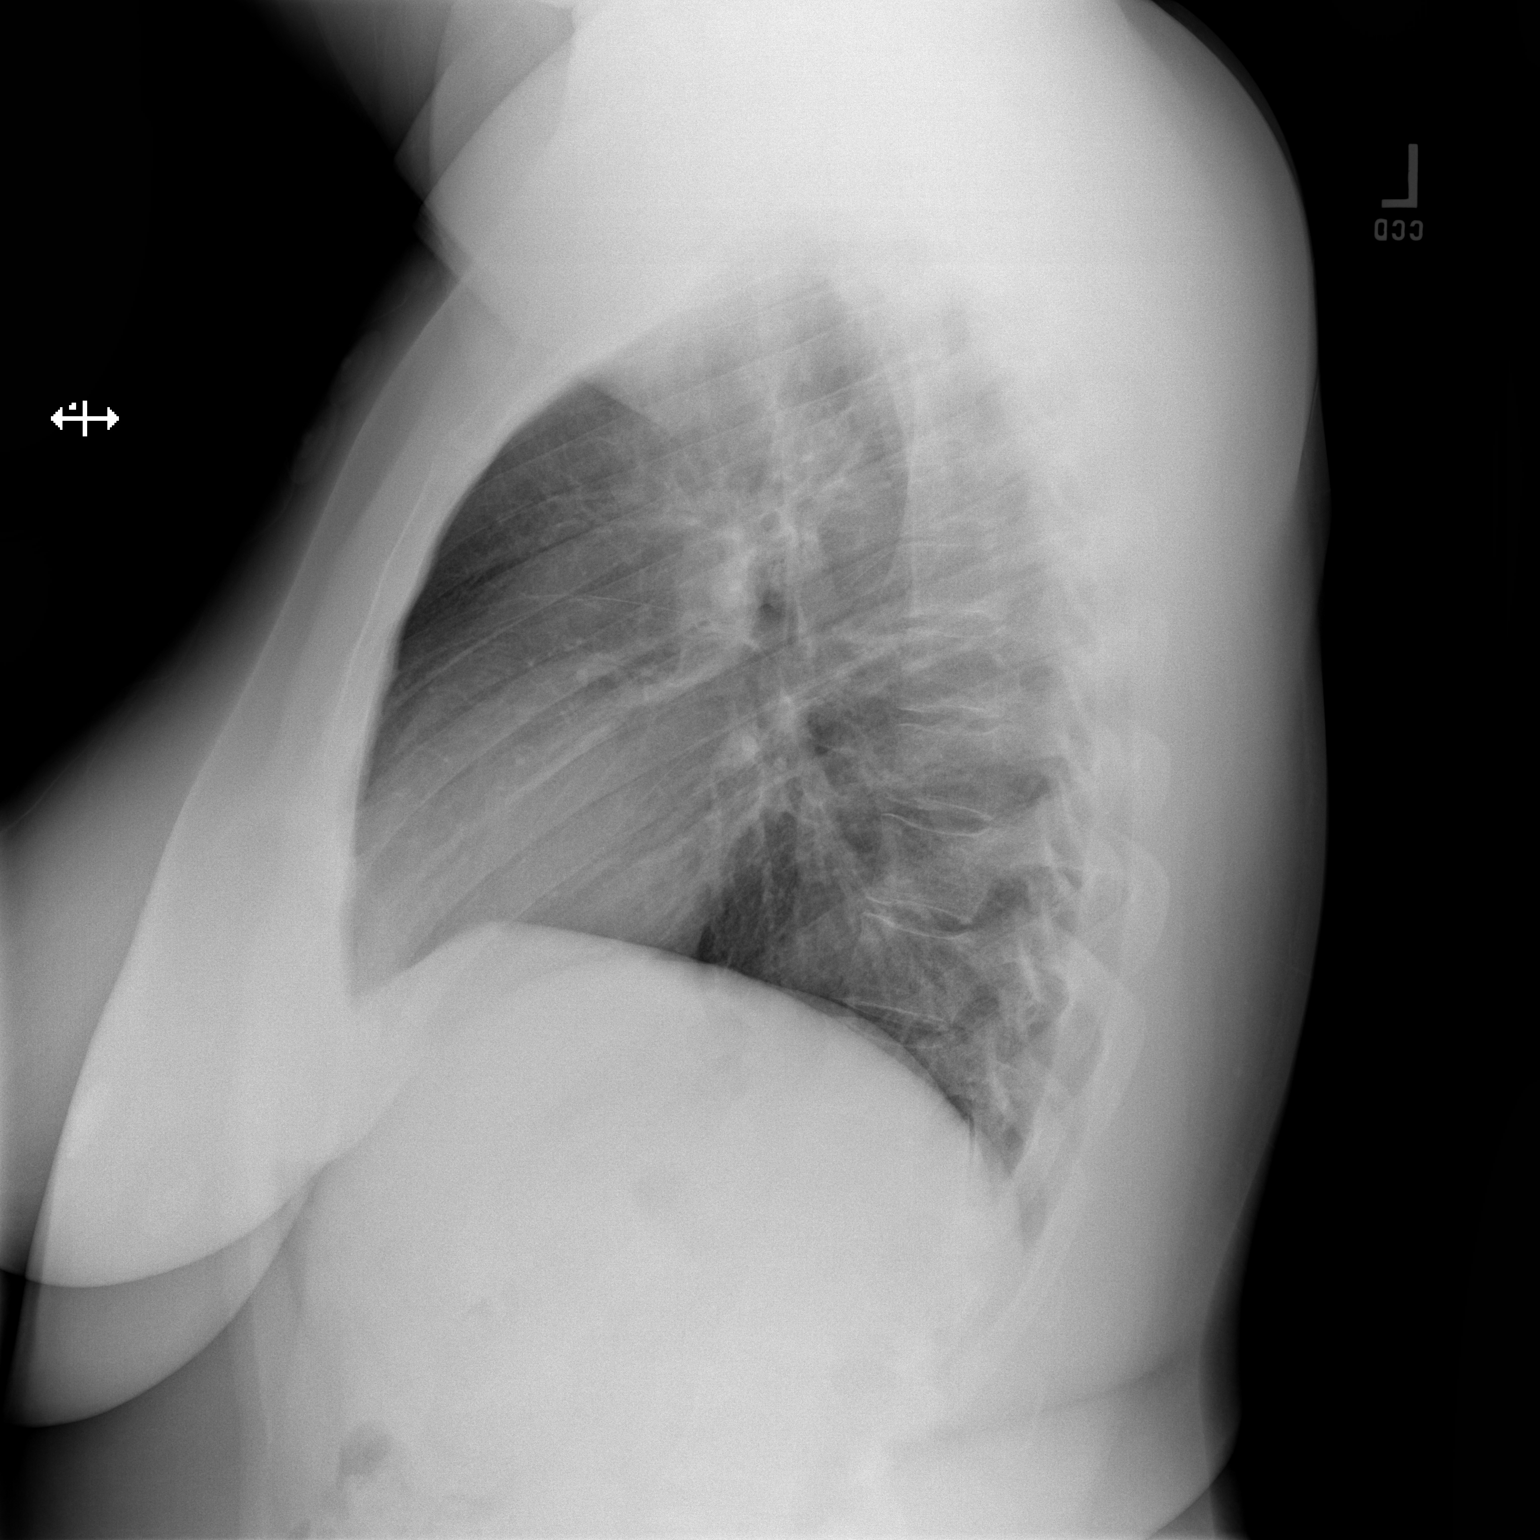

[2 of 2 positions shown; findings below may reference images not displayed]

FINDINGS: The cardiomediastinal silhouette is normal in contour. No pleural
effusion. No pneumothorax. No acute pleuroparenchymal abnormality.
Visualized abdomen is unremarkable. No acute osseous abnormality
noted.
IMPRESSION: No acute cardiopulmonary abnormality.

## 2022-03-06 IMAGING — DX DG CHEST 1V PORT
1 series · 1 of 1 positions shown · non-contrast
Comparison: May 24, 2020

CLINICAL DATA: Cough, weakness and shortness of breath and
diarrhea.

EXAM:
PORTABLE CHEST 1 VIEW

[chest ap]
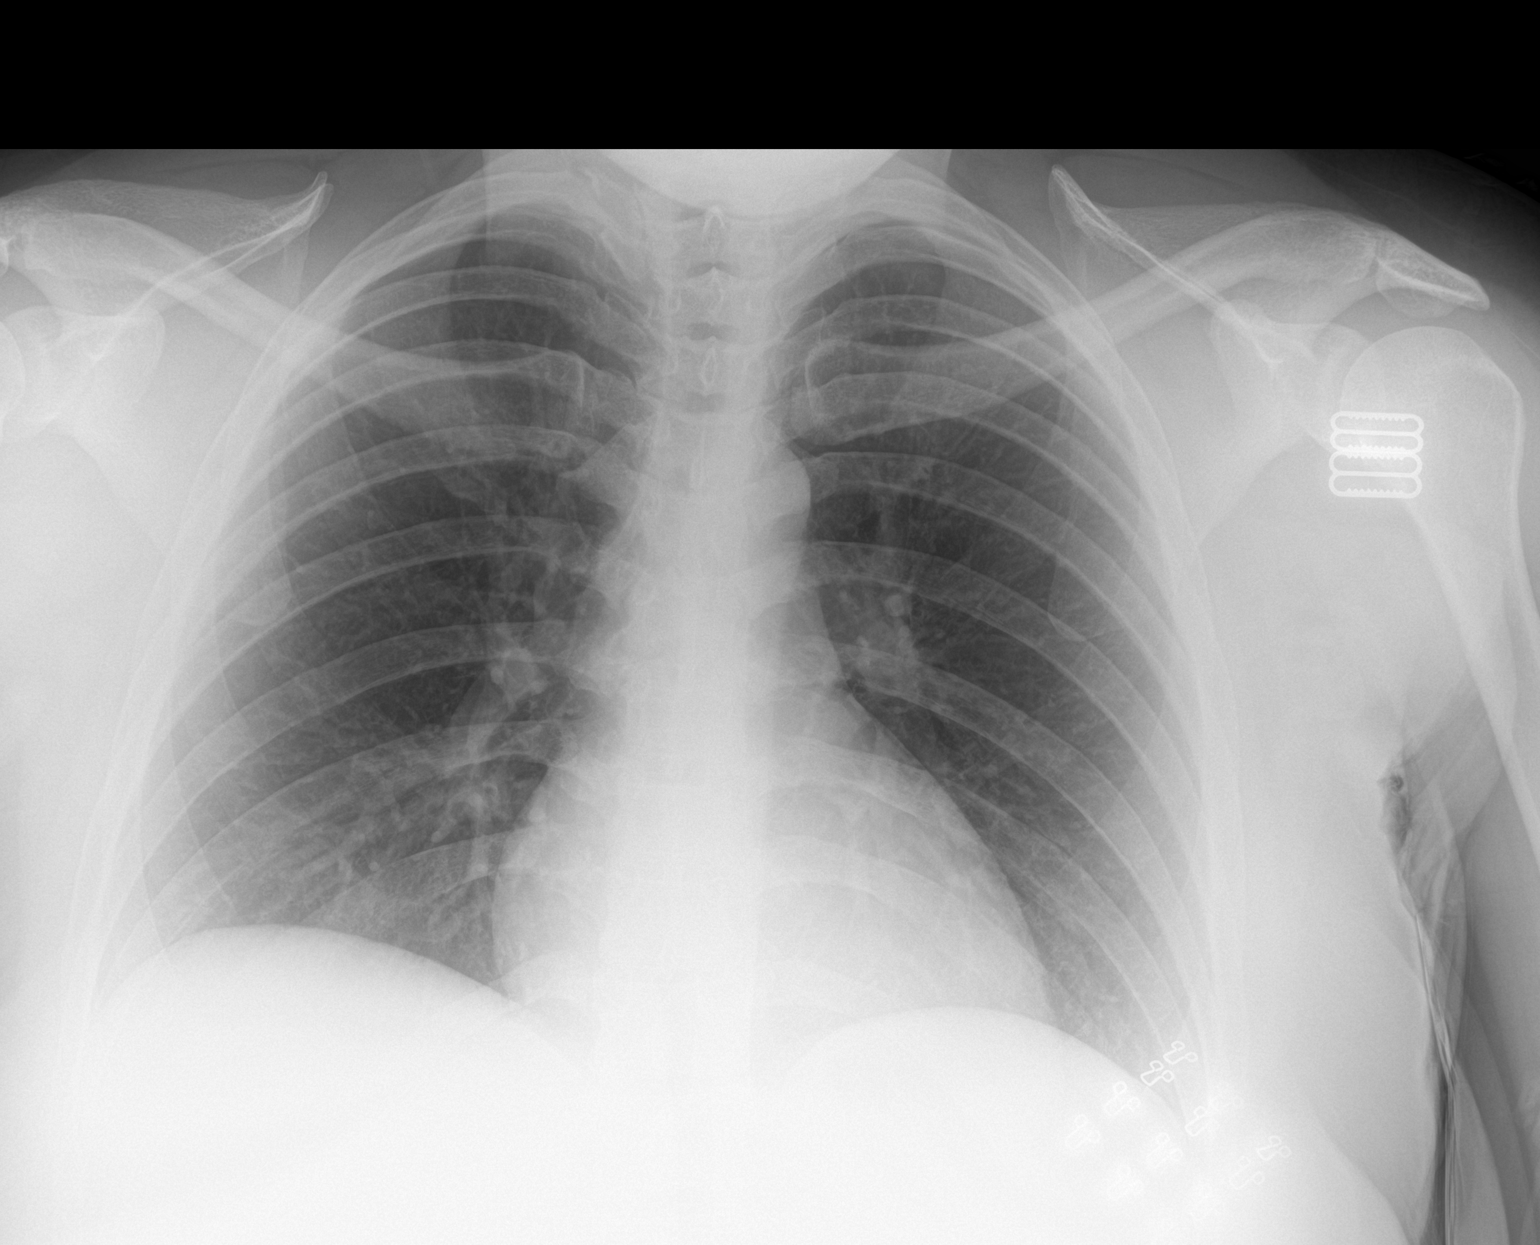

[1 of 1 positions shown; findings below may reference images not displayed]

FINDINGS: Trachea midline. Cardiomediastinal contours and hilar structures are
normal.

Lungs are clear.  No sign of effusion.

On limited assessment no acute skeletal process.
IMPRESSION: No acute cardiopulmonary disease.

## 2022-06-25 IMAGING — CR DG CHEST 1V PORT
1 series · 1 of 1 positions shown · non-contrast
Comparison: Both present sonographically most recently 02/14/2021.
CTA chest, 10/15/2016.

CLINICAL DATA: Green sputum.  Cough x2 months.

EXAM:
PORTABLE CHEST - 1 VIEW

[w chest pa]
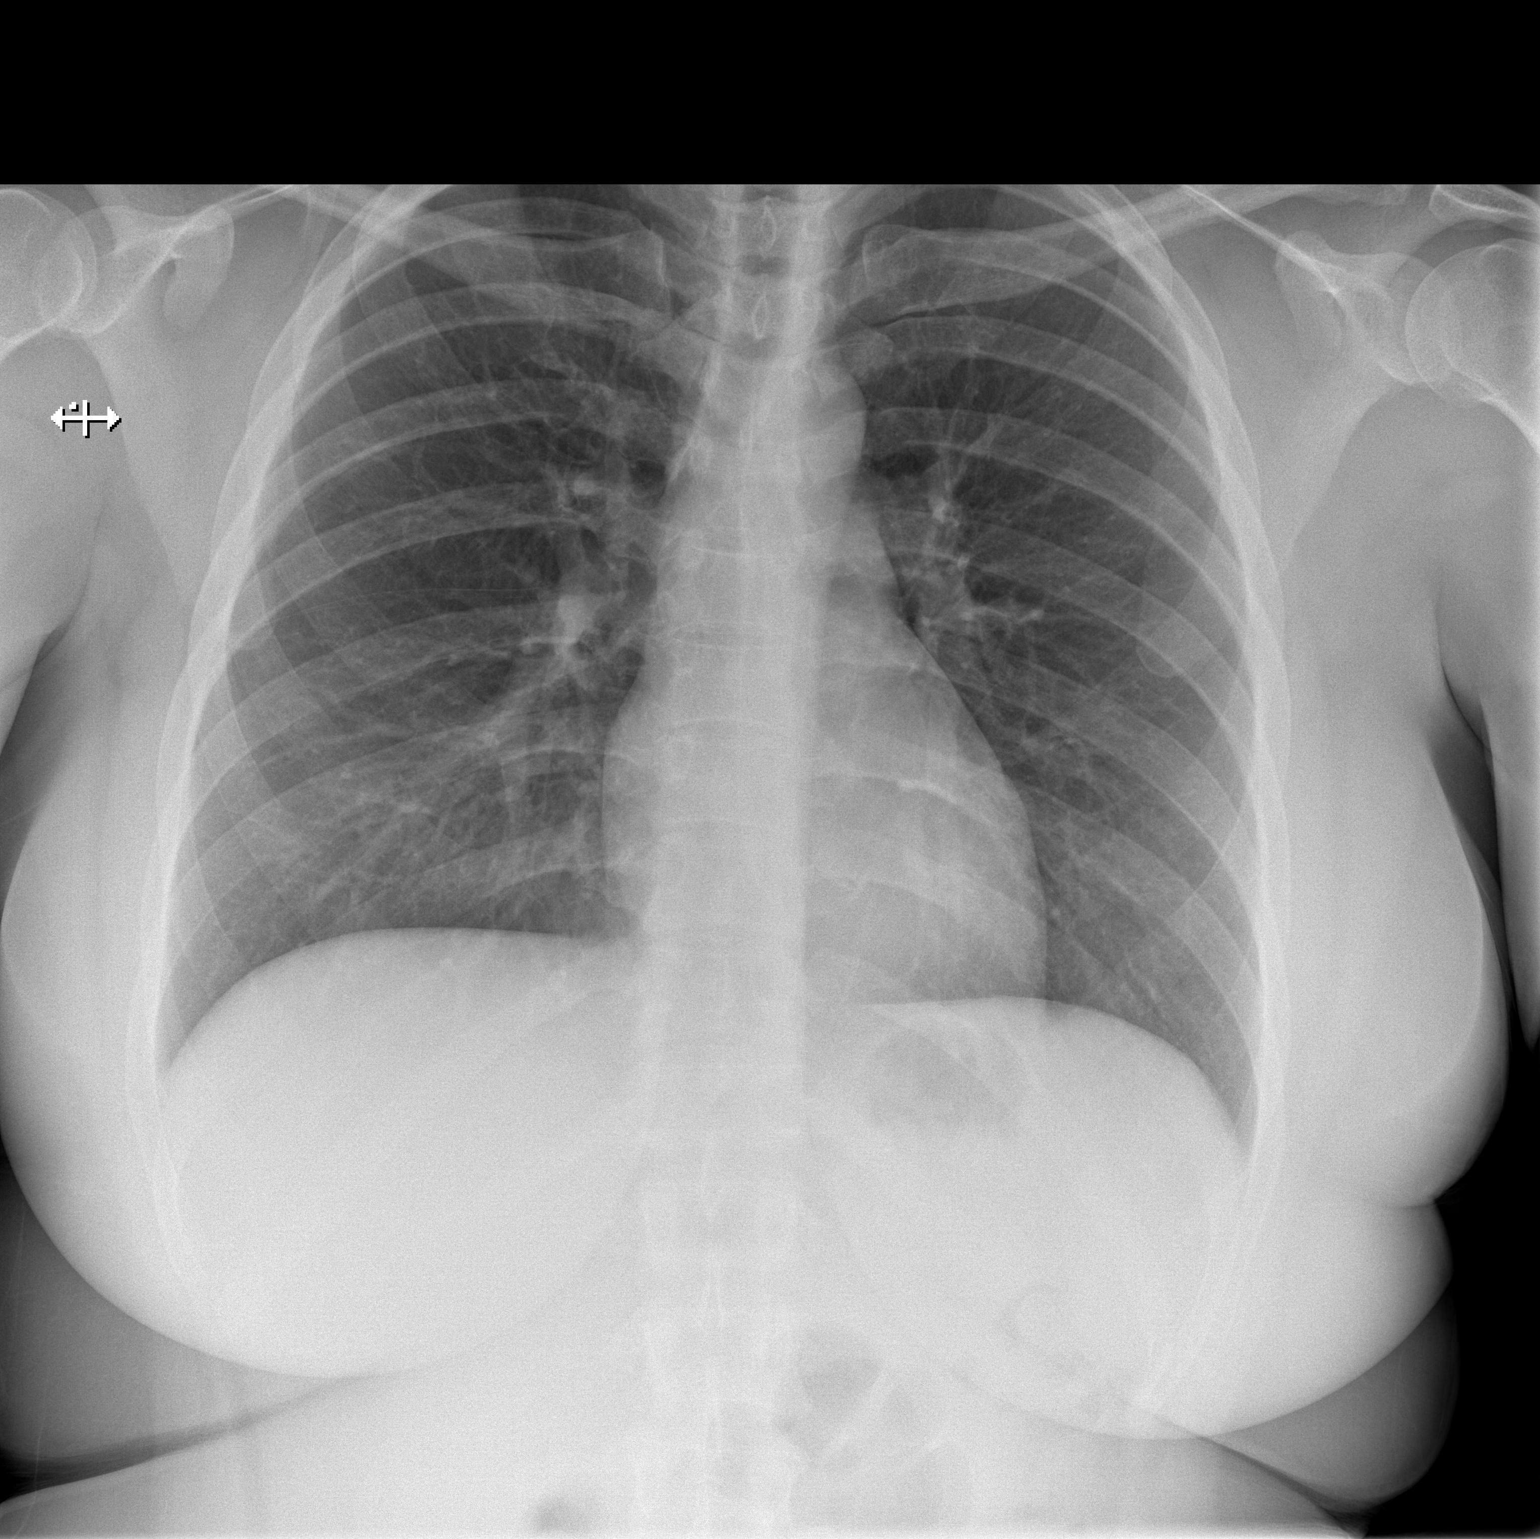

[1 of 1 positions shown; findings below may reference images not displayed]

FINDINGS: Cardiomediastinal silhouette is within normal limits. Lungs are well
inflated. No focal consolidation or mass. No pleural effusion or
pneumothorax. No acute osseous abnormality.
IMPRESSION: Normal chest.
